# Patient Record
Sex: Male | Born: 1935 | Race: White | Hispanic: No | Marital: Married | Smoking: Former smoker
Health system: Southern US, Community
[De-identification: ages and names within clinical notes are randomized; demographics above are authoritative.]

## PROBLEM LIST (undated history)

## (undated) DIAGNOSIS — R7989 Other specified abnormal findings of blood chemistry: Secondary | ICD-10-CM

## (undated) DIAGNOSIS — J449 Chronic obstructive pulmonary disease, unspecified: Secondary | ICD-10-CM

## (undated) DIAGNOSIS — E785 Hyperlipidemia, unspecified: Secondary | ICD-10-CM

## (undated) DIAGNOSIS — I69920 Aphasia following unspecified cerebrovascular disease: Secondary | ICD-10-CM

## (undated) DIAGNOSIS — J4489 Other specified chronic obstructive pulmonary disease: Secondary | ICD-10-CM

## (undated) DIAGNOSIS — I639 Cerebral infarction, unspecified: Secondary | ICD-10-CM

## (undated) DIAGNOSIS — I10 Essential (primary) hypertension: Secondary | ICD-10-CM

## (undated) HISTORY — DX: Aphasia following unspecified cerebrovascular disease: I69.920

## (undated) HISTORY — DX: Other specified abnormal findings of blood chemistry: R79.89

## (undated) HISTORY — DX: Essential (primary) hypertension: I10

## (undated) HISTORY — DX: Cerebral infarction, unspecified: I63.9

## (undated) HISTORY — DX: Hyperlipidemia, unspecified: E78.5

## (undated) HISTORY — DX: Chronic obstructive pulmonary disease, unspecified: J44.9

## (undated) HISTORY — PX: NO PAST SURGERIES: SHX2092

## (undated) HISTORY — DX: Other specified chronic obstructive pulmonary disease: J44.89

---

## 2000-07-20 DIAGNOSIS — I639 Cerebral infarction, unspecified: Secondary | ICD-10-CM

## 2000-07-20 DIAGNOSIS — I69920 Aphasia following unspecified cerebrovascular disease: Secondary | ICD-10-CM

## 2000-07-20 HISTORY — DX: Aphasia following unspecified cerebrovascular disease: I69.920

## 2000-07-20 HISTORY — DX: Cerebral infarction, unspecified: I63.9

## 2001-03-23 ENCOUNTER — Encounter: Payer: Self-pay | Admitting: Neurology

## 2001-03-23 ENCOUNTER — Inpatient Hospital Stay (HOSPITAL_COMMUNITY): Admission: EM | Admit: 2001-03-23 | Discharge: 2001-04-04 | Payer: Self-pay | Admitting: *Deleted

## 2001-03-23 ENCOUNTER — Encounter: Payer: Self-pay | Admitting: *Deleted

## 2001-03-24 ENCOUNTER — Encounter: Payer: Self-pay | Admitting: Neurology

## 2001-03-25 ENCOUNTER — Encounter: Payer: Self-pay | Admitting: Neurology

## 2001-03-26 ENCOUNTER — Encounter: Payer: Self-pay | Admitting: Neurology

## 2001-04-04 ENCOUNTER — Inpatient Hospital Stay (HOSPITAL_COMMUNITY)
Admission: RE | Admit: 2001-04-04 | Discharge: 2001-04-12 | Payer: Self-pay | Admitting: Physical Medicine & Rehabilitation

## 2001-06-22 ENCOUNTER — Ambulatory Visit (HOSPITAL_COMMUNITY): Admission: RE | Admit: 2001-06-22 | Discharge: 2001-06-22 | Payer: Self-pay | Admitting: Neurology

## 2001-06-22 ENCOUNTER — Encounter: Payer: Self-pay | Admitting: Neurology

## 2001-07-05 ENCOUNTER — Encounter
Admission: RE | Admit: 2001-07-05 | Discharge: 2001-10-03 | Payer: Self-pay | Admitting: Physical Medicine & Rehabilitation

## 2001-09-29 ENCOUNTER — Encounter: Admission: RE | Admit: 2001-09-29 | Discharge: 2001-09-29 | Payer: Self-pay | Admitting: Interventional Radiology

## 2004-08-25 ENCOUNTER — Ambulatory Visit: Payer: Self-pay | Admitting: Internal Medicine

## 2004-09-02 ENCOUNTER — Ambulatory Visit: Payer: Self-pay | Admitting: Internal Medicine

## 2004-12-08 ENCOUNTER — Ambulatory Visit: Payer: Self-pay | Admitting: Internal Medicine

## 2004-12-10 ENCOUNTER — Encounter: Admission: RE | Admit: 2004-12-10 | Discharge: 2005-03-10 | Payer: Self-pay | Admitting: Internal Medicine

## 2005-03-31 ENCOUNTER — Ambulatory Visit: Payer: Self-pay | Admitting: Internal Medicine

## 2005-04-21 ENCOUNTER — Ambulatory Visit: Payer: Self-pay | Admitting: Internal Medicine

## 2005-08-05 ENCOUNTER — Ambulatory Visit: Payer: Self-pay | Admitting: Internal Medicine

## 2005-11-03 ENCOUNTER — Ambulatory Visit: Payer: Self-pay | Admitting: Internal Medicine

## 2005-12-16 ENCOUNTER — Ambulatory Visit: Payer: Self-pay | Admitting: Internal Medicine

## 2006-03-11 ENCOUNTER — Ambulatory Visit: Payer: Self-pay | Admitting: Internal Medicine

## 2006-05-25 ENCOUNTER — Ambulatory Visit: Payer: Self-pay | Admitting: Internal Medicine

## 2006-10-15 DIAGNOSIS — R7989 Other specified abnormal findings of blood chemistry: Secondary | ICD-10-CM | POA: Insufficient documentation

## 2006-10-15 DIAGNOSIS — I69920 Aphasia following unspecified cerebrovascular disease: Secondary | ICD-10-CM

## 2006-10-15 DIAGNOSIS — I6990 Unspecified sequelae of unspecified cerebrovascular disease: Secondary | ICD-10-CM

## 2006-11-11 ENCOUNTER — Ambulatory Visit: Payer: Self-pay | Admitting: Internal Medicine

## 2006-11-11 LAB — CONVERTED CEMR LAB
ALT: 20 units/L (ref 0–40)
AST: 34 units/L (ref 0–37)
BUN: 19 mg/dL (ref 6–23)
Basophils Absolute: 0 10*3/uL (ref 0.0–0.1)
Basophils Relative: 0.7 % (ref 0.0–1.0)
Cholesterol: 172 mg/dL (ref 0–200)
Creatinine, Ser: 1 mg/dL (ref 0.4–1.5)
Eosinophils Absolute: 0.2 10*3/uL (ref 0.0–0.6)
Eosinophils Relative: 2.8 % (ref 0.0–5.0)
HCT: 46.1 % (ref 39.0–52.0)
HDL: 39.1 mg/dL (ref >39.0)
Hemoglobin: 15.6 g/dL (ref 13.0–17.0)
Hgb A1c MFr Bld: 6.1 % — ABNORMAL HIGH (ref 4.6–6.0)
LDL Cholesterol: 115 mg/dL — ABNORMAL HIGH (ref 0–99)
Lymphocytes Relative: 25.2 % (ref 12.0–46.0)
MCHC: 33.8 g/dL (ref 30.0–36.0)
MCV: 93.4 fL (ref 78.0–100.0)
Monocytes Absolute: 0.6 10*3/uL (ref 0.2–0.7)
Monocytes Relative: 8.3 % (ref 3.0–11.0)
Neutro Abs: 4.4 10*3/uL (ref 1.4–7.7)
Neutrophils Relative %: 63 % (ref 43.0–77.0)
Platelets: 264 10*3/uL (ref 150–400)
Potassium: 3.8 meq/L (ref 3.5–5.1)
RBC: 4.93 M/uL (ref 4.22–5.81)
RDW: 13.9 % (ref 11.5–14.6)
Rheumatoid fact SerPl-aCnc: <20 intl units/mL — ABNORMAL LOW (ref 0.0–20.0)
Sed Rate: 30 mm/hr — ABNORMAL HIGH (ref 0–20)
Total CHOL/HDL Ratio: 4.4
Triglycerides: 87 mg/dL (ref 0–149)
VLDL: 17 mg/dL (ref 0–40)
WBC: 7 10*3/uL (ref 4.5–10.5)

## 2006-12-01 ENCOUNTER — Emergency Department (HOSPITAL_COMMUNITY): Admission: EM | Admit: 2006-12-01 | Discharge: 2006-12-01 | Payer: Self-pay | Admitting: Emergency Medicine

## 2006-12-03 DIAGNOSIS — I10 Essential (primary) hypertension: Secondary | ICD-10-CM | POA: Insufficient documentation

## 2006-12-09 ENCOUNTER — Encounter: Payer: Self-pay | Admitting: Internal Medicine

## 2006-12-09 ENCOUNTER — Ambulatory Visit: Payer: Self-pay | Admitting: Internal Medicine

## 2006-12-20 ENCOUNTER — Encounter: Payer: Self-pay | Admitting: Internal Medicine

## 2007-02-15 ENCOUNTER — Ambulatory Visit: Payer: Self-pay | Admitting: Internal Medicine

## 2007-02-15 DIAGNOSIS — E785 Hyperlipidemia, unspecified: Secondary | ICD-10-CM

## 2007-02-15 LAB — CONVERTED CEMR LAB: LDL Goal: 100 mg/dL

## 2007-02-21 ENCOUNTER — Encounter (INDEPENDENT_AMBULATORY_CARE_PROVIDER_SITE_OTHER): Payer: Self-pay | Admitting: *Deleted

## 2007-02-21 LAB — CONVERTED CEMR LAB
BUN: 17 mg/dL (ref 6–23)
HDL: 33.1 mg/dL — ABNORMAL LOW (ref >39.0)
Hgb A1c MFr Bld: 6 % (ref 4.6–6.0)
LDL Cholesterol: 77 mg/dL (ref 0–99)
Total CHOL/HDL Ratio: 3.8
Triglycerides: 86 mg/dL (ref 0–149)
VLDL: 17 mg/dL (ref 0–40)

## 2007-06-01 ENCOUNTER — Ambulatory Visit: Payer: Self-pay | Admitting: Internal Medicine

## 2007-06-08 ENCOUNTER — Encounter (INDEPENDENT_AMBULATORY_CARE_PROVIDER_SITE_OTHER): Payer: Self-pay | Admitting: *Deleted

## 2007-06-08 LAB — CONVERTED CEMR LAB: Hgb A1c MFr Bld: 6 % (ref 4.6–6.0)

## 2007-06-13 ENCOUNTER — Telehealth (INDEPENDENT_AMBULATORY_CARE_PROVIDER_SITE_OTHER): Payer: Self-pay | Admitting: *Deleted

## 2007-08-18 ENCOUNTER — Telehealth (INDEPENDENT_AMBULATORY_CARE_PROVIDER_SITE_OTHER): Payer: Self-pay | Admitting: *Deleted

## 2007-08-19 ENCOUNTER — Telehealth (INDEPENDENT_AMBULATORY_CARE_PROVIDER_SITE_OTHER): Payer: Self-pay | Admitting: *Deleted

## 2007-09-21 ENCOUNTER — Telehealth (INDEPENDENT_AMBULATORY_CARE_PROVIDER_SITE_OTHER): Payer: Self-pay | Admitting: *Deleted

## 2007-11-21 ENCOUNTER — Telehealth (INDEPENDENT_AMBULATORY_CARE_PROVIDER_SITE_OTHER): Payer: Self-pay | Admitting: *Deleted

## 2007-12-02 ENCOUNTER — Telehealth (INDEPENDENT_AMBULATORY_CARE_PROVIDER_SITE_OTHER): Payer: Self-pay | Admitting: *Deleted

## 2007-12-05 ENCOUNTER — Encounter (INDEPENDENT_AMBULATORY_CARE_PROVIDER_SITE_OTHER): Payer: Self-pay | Admitting: *Deleted

## 2007-12-05 ENCOUNTER — Ambulatory Visit: Payer: Self-pay | Admitting: Internal Medicine

## 2007-12-05 ENCOUNTER — Telehealth (INDEPENDENT_AMBULATORY_CARE_PROVIDER_SITE_OTHER): Payer: Self-pay | Admitting: *Deleted

## 2007-12-05 LAB — CONVERTED CEMR LAB: Hgb A1c MFr Bld: 6 % (ref 4.6–6.0)

## 2007-12-06 ENCOUNTER — Encounter: Payer: Self-pay | Admitting: Internal Medicine

## 2007-12-08 ENCOUNTER — Encounter (INDEPENDENT_AMBULATORY_CARE_PROVIDER_SITE_OTHER): Payer: Self-pay | Admitting: *Deleted

## 2008-01-23 ENCOUNTER — Telehealth (INDEPENDENT_AMBULATORY_CARE_PROVIDER_SITE_OTHER): Payer: Self-pay | Admitting: *Deleted

## 2008-03-20 ENCOUNTER — Telehealth (INDEPENDENT_AMBULATORY_CARE_PROVIDER_SITE_OTHER): Payer: Self-pay | Admitting: *Deleted

## 2008-04-12 ENCOUNTER — Telehealth (INDEPENDENT_AMBULATORY_CARE_PROVIDER_SITE_OTHER): Payer: Self-pay | Admitting: *Deleted

## 2008-05-21 ENCOUNTER — Telehealth (INDEPENDENT_AMBULATORY_CARE_PROVIDER_SITE_OTHER): Payer: Self-pay | Admitting: *Deleted

## 2008-05-23 ENCOUNTER — Telehealth (INDEPENDENT_AMBULATORY_CARE_PROVIDER_SITE_OTHER): Payer: Self-pay | Admitting: *Deleted

## 2008-05-31 ENCOUNTER — Telehealth (INDEPENDENT_AMBULATORY_CARE_PROVIDER_SITE_OTHER): Payer: Self-pay | Admitting: *Deleted

## 2008-05-31 ENCOUNTER — Ambulatory Visit: Payer: Self-pay | Admitting: Internal Medicine

## 2008-06-05 LAB — CONVERTED CEMR LAB: Hgb A1c MFr Bld: 6 % (ref 4.6–6.0)

## 2008-06-06 ENCOUNTER — Encounter (INDEPENDENT_AMBULATORY_CARE_PROVIDER_SITE_OTHER): Payer: Self-pay | Admitting: *Deleted

## 2008-06-22 ENCOUNTER — Ambulatory Visit: Payer: Self-pay | Admitting: Internal Medicine

## 2008-06-24 LAB — CONVERTED CEMR LAB
ALT: 17 units/L (ref 0–53)
AST: 28 units/L (ref 0–37)
Albumin: 3.8 g/dL (ref 3.5–5.2)
Alkaline Phosphatase: 86 units/L (ref 39–117)
Total CHOL/HDL Ratio: 3.1
Triglycerides: 55 mg/dL (ref 0–149)

## 2008-06-25 ENCOUNTER — Encounter (INDEPENDENT_AMBULATORY_CARE_PROVIDER_SITE_OTHER): Payer: Self-pay | Admitting: *Deleted

## 2008-06-27 ENCOUNTER — Ambulatory Visit: Payer: Self-pay | Admitting: Internal Medicine

## 2008-06-28 ENCOUNTER — Encounter (INDEPENDENT_AMBULATORY_CARE_PROVIDER_SITE_OTHER): Payer: Self-pay | Admitting: *Deleted

## 2008-10-23 ENCOUNTER — Telehealth (INDEPENDENT_AMBULATORY_CARE_PROVIDER_SITE_OTHER): Payer: Self-pay | Admitting: *Deleted

## 2009-02-21 ENCOUNTER — Ambulatory Visit: Payer: Self-pay | Admitting: Internal Medicine

## 2009-03-20 ENCOUNTER — Ambulatory Visit: Payer: Self-pay | Admitting: Internal Medicine

## 2009-03-20 DIAGNOSIS — B029 Zoster without complications: Secondary | ICD-10-CM | POA: Insufficient documentation

## 2009-05-21 ENCOUNTER — Ambulatory Visit: Payer: Self-pay | Admitting: Internal Medicine

## 2009-05-22 LAB — CONVERTED CEMR LAB
AST: 27 units/L (ref 0–37)
Albumin: 3.7 g/dL (ref 3.5–5.2)
Alkaline Phosphatase: 89 units/L (ref 39–117)
BUN: 15 mg/dL (ref 6–23)
CO2: 32 meq/L (ref 19–32)
Calcium: 9.1 mg/dL (ref 8.4–10.5)
Glucose, Bld: 103 mg/dL — ABNORMAL HIGH (ref 70–99)
Potassium: 4.2 meq/L (ref 3.5–5.1)
Sodium: 137 meq/L (ref 135–145)
Total Protein: 7.2 g/dL (ref 6.0–8.3)

## 2009-11-14 ENCOUNTER — Ambulatory Visit: Payer: Self-pay | Admitting: Internal Medicine

## 2010-03-12 ENCOUNTER — Ambulatory Visit: Payer: Self-pay | Admitting: Internal Medicine

## 2010-03-12 LAB — CONVERTED CEMR LAB
ALT: 14 units/L (ref 0–53)
AST: 23 units/L (ref 0–37)
Bilirubin, Direct: 0.2 mg/dL (ref 0.0–0.3)
Creatinine, Ser: 0.9 mg/dL (ref 0.4–1.5)
Glucose, Bld: 97 mg/dL (ref 70–99)
LDL Cholesterol: 74 mg/dL (ref 0–99)
Potassium: 4.5 meq/L (ref 3.5–5.1)
Total Bilirubin: 0.9 mg/dL (ref 0.3–1.2)
Total CHOL/HDL Ratio: 3

## 2010-07-02 ENCOUNTER — Ambulatory Visit: Payer: Self-pay | Admitting: Internal Medicine

## 2010-07-02 DIAGNOSIS — J449 Chronic obstructive pulmonary disease, unspecified: Secondary | ICD-10-CM

## 2010-07-02 DIAGNOSIS — J4489 Other specified chronic obstructive pulmonary disease: Secondary | ICD-10-CM | POA: Insufficient documentation

## 2010-08-21 NOTE — Assessment & Plan Note (Signed)
Summary: 3-4 MO ROV / TO COME FASTING/NWS   Vital Signs:  Patient profile:   77 year old male Height:      63 inches (160.02 cm) Weight:      142.8 pounds (64.91 kg) O2 Sat:      95 % on Room air Temp:     97.7 degrees F (36.50 degrees C) oral Pulse rate:   62 / minute BP sitting:   120 / 62  (left arm) Cuff size:   regular  Vitals Entered By: Orlan Leavens RMA (March 12, 2010 10:29 AM)  O2 Flow:  Room air CC: 3 month follow-up Is Patient Diabetic? No Pain Assessment Patient in pain? no        Primary Care Provider:  Newt Lukes MD  CC:  3 month follow-up.  History of Present Illness: here for followup - feels well today  1) HTN -  taking meds as rx'd - no adv SE or changes mildly low BP noted occassionally at home - no symptoms however  2) hx shingles - 03/2009 occurence -  still with min tingle in right chest but rash resolved, occ still using lotion  3) dylipidemia -  no problems with meds (no muscle or GI upset) -  100% compliance with meds  4) COPD  continued cough, daily occurence - nonproductive - denies SOB never on meds for same   Preventive Screening-Counseling & Management  Alcohol-Tobacco     Smoking Status: quit  Clinical Review Panels:  Immunizations   Last Tetanus Booster:  Td (05/21/2009)   Last Flu Vaccine:  Fluvax 3+ (05/21/2009)   Last Pneumovax:  Pneumovax (Medicare) (05/21/2009)  Lipid Management   Cholesterol:  127 (05/21/2009)   LDL (bad choesterol):  70 (05/21/2009)   HDL (good cholesterol):  46.80 (05/21/2009)  CBC   WBC:  7.0 (11/11/2006)   RBC:  4.93 (11/11/2006)   Hgb:  15.6 (11/11/2006)   Hct:  46.1 (11/11/2006)   Platelets:  264 (11/11/2006)   MCV  93.4 (11/11/2006)   MCHC  33.8 (11/11/2006)   RDW  13.9 (11/11/2006)   PMN:  63.0 (11/11/2006)   Lymphs:  25.2 (11/11/2006)   Monos:  8.3 (11/11/2006)   Eosinophils:  2.8 (11/11/2006)   Basophil:  0.7 (11/11/2006)  Complete Metabolic Panel   Glucose:  103  (05/21/2009)   Sodium:  137 (05/21/2009)   Potassium:  4.2 (05/21/2009)   Chloride:  99 (05/21/2009)   CO2:  32 (05/21/2009)   BUN:  15 (05/21/2009)   Creatinine:  1.0 (05/21/2009)   Albumin:  3.7 (05/21/2009)   Total Protein:  7.2 (05/21/2009)   Calcium:  9.1 (05/21/2009)   Total Bili:  1.1 (05/21/2009)   Alk Phos:  89 (05/21/2009)   SGPT (ALT):  17 (05/21/2009)   SGOT (AST):  27 (05/21/2009)   Current Medications (verified): 1)  Protonix 40 Mg  Solr (Pantoprazole Sodium) .... Take One Daily 2)  Adult Aspirin Low Strength 81 Mg  Tbdp (Aspirin) 3)  Hydrochlorothiazide 25 Mg  Tabs (Hydrochlorothiazide) .... Take One-Half Tab Daily 4)  Simvastatin 40 Mg  Tabs (Simvastatin) .... Take One At Bedtime 5)  Metoprolol Tartrate 25 Mg  Tabs (Metoprolol Tartrate) .Marland Kitchen.. 1 By Mouth Bid 6)  Fish Oil 1200 Mg Caps (Omega-3 Fatty Acids) .Marland Kitchen.. 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         c/o arthritis pain in left hand in AM  Physical Exam  General:  in no acute distress; cooperative throughout examination; stigmata CVA RUE & dysarthria - wife and dtr at side Lungs:  normal respiratory effort, no intercostal retractions or use of accessory muscles; normal breath sounds bilaterally - no crackles and no wheezes.    Heart:  normal rate, regular rhythm, no murmur, and no rub. BLE without edema. Msk:  arthritic changes left hand Neurologic:  mild dysarthria and word finding difficulty - alert & oriented X3 and cranial nerves II-XII symetrically intact.  RUE with 4+/5 strength - mild HP. gait actatic but walks unaided. follows commands with good comprehension.    Impression & Recommendations:  Problem # 1:  HYPERTENSION (ICD-401.9)  His updated  medication list for this problem includes:    Hydrochlorothiazide 12.5 Mg Tabs (Hydrochlorothiazide) .Marland Kitchen... 1 by mouth once daily    Metoprolol Tartrate 25 Mg Tabs (Metoprolol tartrate) .Marland Kitchen... 1 by mouth two times a day  BP today: 120/62 Prior BP: 130/68 (11/14/2009)  Prior 10 Yr Risk Heart Disease: 22 % (02/15/2007)  Labs Reviewed: K+: 4.2 (05/21/2009) Creat: : 1.0 (05/21/2009)   Chol: 127 (05/21/2009)   HDL: 46.80 (05/21/2009)   LDL: 70 (05/21/2009)   TG: 49.0 (05/21/2009)  Orders: TLB-Creatinine, Blood (82565-CREA) TLB-Potassium (K+) (84132-K)  Problem # 2:  HYPERLIPIDEMIA NEC/NOS (ICD-272.4)  His updated medication list for this problem includes:    Simvastatin 40 Mg Tabs (Simvastatin) .Marland Kitchen... Take one at bedtime  Labs Reviewed: SGOT: 27 (05/21/2009)   SGPT: 17 (05/21/2009)  Lipid Goals: Chol Goal: 200 (02/15/2007)   HDL Goal: 40 (02/15/2007)   LDL Goal: 100 (02/15/2007)   TG Goal: 150 (02/15/2007)  Prior 10 Yr Risk Heart Disease: 22 % (02/15/2007)   HDL:46.80 (05/21/2009), 40.5 (06/22/2008)  LDL:70 (05/21/2009), 76 (06/22/2008)  Chol:127 (05/21/2009), 127 (06/22/2008)  Trig:49.0 (05/21/2009), 55 (06/22/2008)  Orders: TLB-Lipid Panel (80061-LIPID)  Problem # 3:  APHASIA, LE CEREBROVASCULAR DISEASE (ICD-438.11)  His updated medication list for this problem includes:    Adult Aspirin Low Strength 81 Mg Tbdp (Aspirin) .Marland Kitchen... 1 by mouth once daily  Complete Medication List: 1)  Protonix 40 Mg Solr (Pantoprazole sodium) .... Take one daily 2)  Adult Aspirin Low Strength 81 Mg Tbdp (Aspirin) .Marland Kitchen.. 1 by mouth once daily 3)  Hydrochlorothiazide 12.5 Mg Tabs (Hydrochlorothiazide) .Marland Kitchen.. 1 by mouth once daily 4)  Simvastatin 40 Mg Tabs (Simvastatin) .... Take one at bedtime 5)  Metoprolol Tartrate 25 Mg Tabs (Metoprolol tartrate) .Marland Kitchen.. 1 by mouth two times a day 6)  Fish Oil 1200 Mg Caps (Omega-3 fatty acids) .Marland Kitchen.. 1 by mouth once daily  Other Orders: TLB-Hepatic/Liver Function Pnl  (80076-HEPATIC) TLB-Glucose, QUANT (82947-GLU)  Patient Instructions: 1)  it was good to see you today.  2)  test(s) ordered today - your results will be posted on the phone tree for review in 48-72 hours from the time of test completion; call [phone] and enter your 9 digit MRN (listed above on this page, just below your name); if any changes need to be made or there are abnormal results, you will be contacted directly.  3)  changed to 12.5 mg tablet of HCTZ today (no more cutting in half)- no other medication changes today 4)  Please schedule a follow-up appointment in 4-6 months, sooner if problems.  Prescriptions: HYDROCHLOROTHIAZIDE 12.5 MG TABS (HYDROCHLOROTHIAZIDE) 1 by mouth  once daily  #30 x 11   Entered and Authorized by:   Newt Lukes MD   Signed by:   Newt Lukes MD on 03/12/2010   Method used:   Electronically to        [facility] [phone]* (retail)       [address]       [place], [place]  [address]       Ph: [phone]       Fax: [phone]   RxID:   [id number]   Prevention & Chronic Care Immunizations   Influenza vaccine: Fluvax 3+  (05/21/2009)    Tetanus booster: 05/21/2009: Td    Pneumococcal vaccine: Pneumovax (Medicare)  (05/21/2009)    H. zoster vaccine: Not documented  Colorectal Screening   Hemoccult: Not documented    Colonoscopy: Not documented  Other Screening   PSA: Not documented   Smoking status: quit  (03/12/2010)  Lipids   Total Cholesterol: 127  (05/21/2009)   LDL: 70  (05/21/2009)   LDL Direct: Not documented   HDL: 46.80  (05/21/2009)   Triglycerides: 49.0  (05/21/2009)    SGOT (AST): 27  (05/21/2009)   SGPT (ALT): 17  (05/21/2009)   Alkaline phosphatase: 89  (05/21/2009)   Total bilirubin: 1.1  (05/21/2009)  Hypertension   Last Blood Pressure: 120 / 62  (03/12/2010)   Serum creatinine: 1.0  (05/21/2009)   Serum potassium 4.2  (05/21/2009)  Self-Management Support :    Hypertension  self-management support: Not documented    Lipid self-management support: Not documented

## 2010-08-21 NOTE — Assessment & Plan Note (Signed)
Summary: 4-6 MTH FU--STC   Vital Signs:  Patient profile:   77 year old male Height:      63 inches (160.02 cm) Weight:      141.12 pounds (64.15 kg) BMI:     25.09 O2 Sat:      96 % on Room air Temp:     97.2 degrees F (36.22 degrees C) oral Pulse rate:   63 / minute BP sitting:   128 / 62  (left arm) Cuff size:   regular  Vitals Entered By: Orlan Leavens RMA (July 02, 2010 11:00 AM)  O2 Flow:  Room air CC: 4-6 MONTH FOLLOW-UP Is Patient Diabetic? No Pain Assessment Patient in pain? no      Comments Pt want flu shot   Primary Care Provider:  Newt Lukes MD  CC:  4-6 MONTH FOLLOW-UP.  History of Present Illness: here for followup - feels well today  1) HTN -  taking meds as rx'd - no adv SE or changes mildly low BP noted occassionally at home - no symptoms however when low  2) hx shingles - 03/2009 occurence -  still with min tingle in right chest but rash resolved, occ still using lotion  3) dylipidemia -  no problems with meds (no muscle or GI upset) -  100% compliance with meds  4) COPD - no flares continued cough, daily occurence - nonproductive - denies SOB never on meds for same   Clinical Review Panels:  Immunizations   Last Tetanus Booster:  Td (05/21/2009)   Last Flu Vaccine:  Fluvax MCR (07/02/2010)   Last Pneumovax:  Pneumovax (Medicare) (05/21/2009)  Lipid Management   Cholesterol:  137 (03/12/2010)   LDL (bad choesterol):  74 (03/12/2010)   HDL (good cholesterol):  49.20 (03/12/2010)  Diabetes Management   HgBA1C:  6.0 (05/31/2008)   Creatinine:  0.9 (03/12/2010)   Last Flu Vaccine:  Fluvax MCR (07/02/2010)   Last Pneumovax:  Pneumovax (Medicare) (05/21/2009)  CBC   WBC:  7.0 (11/11/2006)   RBC:  4.93 (11/11/2006)   Hgb:  15.6 (11/11/2006)   Hct:  46.1 (11/11/2006)   Platelets:  264 (11/11/2006)   MCV  93.4 (11/11/2006)   MCHC  33.8 (11/11/2006)   RDW  13.9 (11/11/2006)   PMN:  63.0 (11/11/2006)   Lymphs:  25.2  (11/11/2006)   Monos:  8.3 (11/11/2006)   Eosinophils:  2.8 (11/11/2006)   Basophil:  0.7 (11/11/2006)  Complete Metabolic Panel   Glucose:  97 (03/12/2010)   Sodium:  137 (05/21/2009)   Potassium:  4.5 (03/12/2010)   Chloride:  99 (05/21/2009)   CO2:  32 (05/21/2009)   BUN:  15 (05/21/2009)   Creatinine:  0.9 (03/12/2010)   Albumin:  3.9 (03/12/2010)   Total Protein:  7.0 (03/12/2010)   Calcium:  9.1 (05/21/2009)   Total Bili:  0.9 (03/12/2010)   Alk Phos:  84 (03/12/2010)   SGPT (ALT):  14 (03/12/2010)   SGOT (AST):  23 (03/12/2010)   Current Medications (verified): 1)  Protonix 40 Mg  Solr (Pantoprazole Sodium) .... Take One Daily 2)  Adult Aspirin Low Strength 81 Mg  Tbdp (Aspirin) .Marland Kitchen.. 1 By Mouth Once Daily 3)  Hydrochlorothiazide 12.5 Mg Tabs (Hydrochlorothiazide) .Marland Kitchen.. 1 By Mouth Once Daily 4)  Simvastatin 40 Mg  Tabs (Simvastatin) .... Take One At Bedtime 5)  Metoprolol Tartrate 25 Mg  Tabs (Metoprolol Tartrate) .Marland Kitchen.. 1 By Mouth Two Times A Day 6)  Fish Oil 1200 Mg Caps (Omega-3  Fatty Acids) .Marland Kitchen.. 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    Physical Exam  General:  in no acute distress; cooperative throughout examination; stigmata CVA RUE & dysarthria - dtr at side Lungs:  normal respiratory effort, no intercostal retractions or use of accessory muscles; normal breath sounds bilaterally - no crackles and no wheezes.    Heart:  normal rate, regular rhythm, no murmur, and no rub. BLE without edema. Neurologic:  mild dysarthria and word finding difficulty - alert & oriented X3 and cranial nerves II-XII symetrically intact.  RUE with 4+/5 strength - mild HP. gait actatic but walks unaided. follows commands with good comprehension.      Impression & Recommendations:  Problem # 1:  HYPERLIPIDEMIA NEC/NOS (ICD-272.4)  His updated medication list for this problem includes:    Simvastatin 40 Mg Tabs (Simvastatin) .Marland Kitchen... Take one at bedtime  Labs Reviewed: SGOT: 23 (03/12/2010)   SGPT: 14 (03/12/2010)  Lipid Goals: Chol Goal: 200 (02/15/2007)   HDL Goal: 40 (02/15/2007)   LDL Goal: 100 (02/15/2007)   TG Goal: 150 (02/15/2007)  Prior 10 Yr Risk Heart Disease: 22 % (02/15/2007)   HDL:49.20 (03/12/2010), 46.80 (05/21/2009)  LDL:74 (03/12/2010), 70 (05/21/2009)  Chol:137 (03/12/2010), 127 (05/21/2009)  Trig:67.0 (03/12/2010), 49.0 (05/21/2009)  Problem # 2:  HYPERTENSION (ICD-401.9)  His updated medication list for this problem includes:    Hydrochlorothiazide 12.5 Mg Tabs (Hydrochlorothiazide) .Marland Kitchen... 1 by mouth once daily    Metoprolol Tartrate 25 Mg Tabs (Metoprolol tartrate) .Marland Kitchen... 1 by mouth two times a day  BP today: 128/62 Prior BP: 120/62 (03/12/2010)  Prior 10 Yr Risk Heart Disease: 22 % (02/15/2007)  Labs Reviewed: K+: 4.5 (03/12/2010) Creat: : 0.9 (03/12/2010)   Chol: 137 (03/12/2010)   HDL: 49.20 (03/12/2010)   LDL: 74 (03/12/2010)   TG: 67.0 (03/12/2010)  Problem # 3:  APHASIA, LE CEREBROVASCULAR DISEASE (ICD-438.11)  His updated medication list for this problem includes:    Adult Aspirin Low Strength 81 Mg Tbdp (Aspirin) .Marland Kitchen... 1 by mouth once daily  Problem # 4:  COUGH (ICD-786.2) chronic bronchitis - no change or flare likely COPD - hx tobacco abuse - prior CXR 2009 - COPD  pt denies symptoms or problems despite family concerns low normal O2 but exam lungs clear  f/u as needed   Problem # 5:  HYPERGLYCEMIA (ICD-790.6)  plan to reck a1c next OV with labs -  Complete Medication List: 1)  Protonix 40 Mg Solr (Pantoprazole sodium) .... Take one daily 2)  Adult Aspirin Low Strength 81 Mg Tbdp (Aspirin) .Marland Kitchen.. 1 by mouth once daily 3)  Hydrochlorothiazide 12.5 Mg Tabs (Hydrochlorothiazide) .Marland Kitchen.. 1  by mouth once daily 4)  Simvastatin 40 Mg Tabs (Simvastatin) .... Take one at bedtime 5)  Metoprolol Tartrate 25 Mg Tabs (Metoprolol tartrate) .Marland Kitchen.. 1 by mouth two times a day 6)  Fish Oil 1200 Mg Caps (Omega-3 fatty acids) .Marland Kitchen.. 1 by mouth once daily  Other Orders: Administration Flu vaccine - MCR (G0008) Admin 1st Vaccine (96045)  Patient Instructions: 1)  it was good to see you today.  2)  medications reviewed - no changes 3)  Please schedule a follow-up appointment in 3-6 months, sooner if problems.  will check cholesterol and a1c (for sugar monitoring) next visit  Orders Added: 1)  Administration Flu vaccine - MCR [G0008] 2)  Admin 1st Vaccine [90471] 3)  Est. Patient Level III [13086]   Immunizations Administered:  Influenza Vaccine # 1:    Vaccine Type: Fluvax MCR    Site: left deltoid    Mfr: Sanofi Pasteur    Dose: 0.5 ml    Route: IM    Given by: Orlan Leavens RMA    Exp. Date: 01/17/2011    Lot #: [id number]    VIS given: 07/02/10  Flu Vaccine Consent Questions:    Do you have a history of severe allergic reactions to this vaccine? no    Any prior history of allergic reactions to egg and/or gelatin? no    Do you have a sensitivity to the preservative Thimersol? no    Do you have a past history of Guillan-Barre Syndrome? no    Do you currently have an acute febrile illness? no    Have you ever had a severe reaction to latex? no    Vaccine information given and explained to patient? yes   Immunizations Administered:  Influenza Vaccine # 1:    Vaccine Type: Fluvax MCR    Site: left deltoid    Mfr: Sanofi Pasteur    Dose: 0.5 ml    Route: IM    Given by: Orlan Leavens RMA    Exp. Date: 01/17/2011    Lot #: [id number]    VIS given: 07/02/10

## 2010-08-21 NOTE — Assessment & Plan Note (Signed)
Summary: 6 MTH FU  STC   Vital Signs:  Patient profile:   77 year old male Height:      63 inches (160.02 cm) Weight:      144.0 pounds (65.45 kg) BMI:     25.60 O2 Sat:      92 % on Room air Temp:     97.9 degrees F (36.61 degrees C) oral Pulse rate:   83 / minute BP sitting:   130 / 68  (left arm) Cuff size:   regular  Vitals Entered By: Orlan Leavens (November 14, 2009 10:29 AM)  O2 Flow:  Room air CC: 6 month follow-up Is Patient Diabetic? No Pain Assessment Patient in pain? no        Primary Care Provider:  Newt Lukes MD  CC:  6 month follow-up.  History of Present Illness: here for followup - feels well today  1)HTN -  taking meds as rx'd - no adv SE or changes mildly low BP noted today - no symptoms however  2) hx shingles - 03/2009 occurence -  still with min tingle in right chest but rash resolved, occ still using lotion  3) dylipidemia -  no problems with meds (no muscle or GI upset) -  100% compliance with meds  4) cough, daily occurence - nonproductive - denies SOB   Clinical Review Panels:  Immunizations   Last Tetanus Booster:  Td (05/21/2009)   Last Flu Vaccine:  Fluvax 3+ (05/21/2009)   Last Pneumovax:  Pneumovax (Medicare) (05/21/2009)  Lipid Management   Cholesterol:  127 (05/21/2009)   LDL (bad choesterol):  70 (05/21/2009)   HDL (good cholesterol):  46.80 (05/21/2009)  CBC   WBC:  7.0 (11/11/2006)   RBC:  4.93 (11/11/2006)   Hgb:  15.6 (11/11/2006)   Hct:  46.1 (11/11/2006)   Platelets:  264 (11/11/2006)   MCV  93.4 (11/11/2006)   MCHC  33.8 (11/11/2006)   RDW  13.9 (11/11/2006)   PMN:  63.0 (11/11/2006)   Lymphs:  25.2 (11/11/2006)   Monos:  8.3 (11/11/2006)   Eosinophils:  2.8 (11/11/2006)   Basophil:  0.7 (11/11/2006)  Complete Metabolic Panel   Glucose:  103 (05/21/2009)   Sodium:  137 (05/21/2009)   Potassium:  4.2 (05/21/2009)   Chloride:  99 (05/21/2009)   CO2:  32 (05/21/2009)   BUN:  15 (05/21/2009)  Creatinine:  1.0 (05/21/2009)   Albumin:  3.7 (05/21/2009)   Total Protein:  7.2 (05/21/2009)   Calcium:  9.1 (05/21/2009)   Total Bili:  1.1 (05/21/2009)   Alk Phos:  89 (05/21/2009)   SGPT (ALT):  17 (05/21/2009)   SGOT (AST):  27 (05/21/2009)   Current Medications (verified): 1)  Protonix 40 Mg  Solr (Pantoprazole Sodium) .... Take One Daily 2)  Adult Aspirin Low Strength 81 Mg  Tbdp (Aspirin) 3)  Hydrochlorothiazide 25 Mg  Tabs (Hydrochlorothiazide) .... Take One-Half Tab Daily 4)  Simvastatin 40 Mg  Tabs (Simvastatin) .... Take One At Bedtime 5)  Metoprolol Tartrate 25 Mg  Tabs (Metoprolol Tartrate) .Marland Kitchen.. 1 By Mouth Bid 6)  Fish Oil 1200 Mg Caps (Omega-3 Fatty Acids) .Marland Kitchen.. 1 By Mouth Once Daily 7)  Calaclear 1-0.1 % Lotn (Pramoxine-Zinc Acetate) .... 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    Physical Exam  General:  in no acute distress; cooperative throughout examination; stigmata CVA RUE & dysarthria Lungs:  normal respiratory effort, no intercostal retractions or use of accessory muscles; normal breath sounds bilaterally - no crackles and no wheezes.    Heart:  normal rate, regular rhythm, no murmur, and no rub. BLE without edema. Neurologic:  mild dysarthria and word finding difficulty - alert & oriented X3 and cranial nerves II-XII symetrically intact.  RUE with 4+/5 strenght - mild HP. gait actatic but walks unaided. follows commands with good comprehension.    Impression & Recommendations:  Problem # 1:  COUGH (ICD-786.2) ?chronic bronchitis or ?COPD - hx tobacco abuse - prior CXR 2009 - COPD  pt denies symptoms or problems despite family concerns low normal O2 but exam lungs clear  f/u as  needed   Problem # 2:  HYPERTENSION (ICD-401.9)  His updated medication list for this problem includes:    Hydrochlorothiazide 25 Mg Tabs (Hydrochlorothiazide) .Marland Kitchen... Take one-half tab daily    Metoprolol Tartrate 25 Mg Tabs (Metoprolol tartrate) .Marland Kitchen... 1 by mouth bid  BP today: 130/68 Prior BP: 100/42 (05/21/2009)  Prior 10 Yr Risk Heart Disease: 22 % (02/15/2007)  Labs Reviewed: K+: 4.2 (05/21/2009) Creat: : 1.0 (05/21/2009)   Chol: 127 (05/21/2009)   HDL: 46.80 (05/21/2009)   LDL: 70 (05/21/2009)   TG: 49.0 (05/21/2009)  Problem # 3:  HYPERLIPIDEMIA NEC/NOS (ICD-272.4)  His updated medication list for this problem includes:    Simvastatin 40 Mg Tabs (Simvastatin) .Marland Kitchen... Take one at bedtime  Labs Reviewed: SGOT: 27 (05/21/2009)   SGPT: 17 (05/21/2009)  Lipid Goals: Chol Goal: 200 (02/15/2007)   HDL Goal: 40 (02/15/2007)   LDL Goal: 100 (02/15/2007)   TG Goal: 150 (02/15/2007)  Prior 10 Yr Risk Heart Disease: 22 % (02/15/2007)   HDL:46.80 (05/21/2009), 40.5 (06/22/2008)  LDL:70 (05/21/2009), 76 (06/22/2008)  Chol:127 (05/21/2009), 127 (06/22/2008)  Trig:49.0 (05/21/2009), 55 (06/22/2008)  Problem # 4:  CEREBROVASCULAR DISEASE LATE EFFECTS, NOS (ICD-438.9)  His updated medication list for this problem includes:    Adult Aspirin Low Strength 81 Mg Tbdp (Aspirin)  Problem # 5:  APHASIA, LE CEREBROVASCULAR DISEASE (ICD-438.11)  His updated medication list for this problem includes:    Adult Aspirin Low Strength 81 Mg Tbdp (Aspirin)  Problem # 6:  HYPERGLYCEMIA (ICD-790.6) plan to rek a1c next OV with labs -  Time spent with patient, wife and caretaker 25 minutes, more than 50% of this time was spent counseling patient on cough and symptoms of COPD as well as reviewing other medications and home situation  Complete Medication List: 1)  Protonix 40 Mg Solr (Pantoprazole sodium) .... Take one daily 2)  Adult Aspirin Low Strength 81 Mg Tbdp (Aspirin) 3)  Hydrochlorothiazide 25  Mg Tabs (Hydrochlorothiazide) .... Take one-half tab daily 4)  Simvastatin 40 Mg Tabs (Simvastatin) .... Take one at bedtime 5)  Metoprolol Tartrate 25 Mg Tabs (Metoprolol tartrate) .Marland Kitchen.. 1 by mouth bid 6)  Fish Oil 1200 Mg Caps (Omega-3 fatty acids) .Marland Kitchen.. 1 by mouth once daily 7)  Calaclear 1-0.1 % Lotn (Pramoxine-zinc acetate) .... Apply three times a day to rash as needed  Patient Instructions: 1)  it was good to see you today.  2)  no medication changes today - if the coughing or breathing is bad, we can do other testing and try medications to help this! you let us know 3)  Please schedule a follow-up appointment  in 3-4 months, sooner if problems. will check labs at that visit so come in fasting!

## 2010-10-08 ENCOUNTER — Encounter: Payer: Self-pay | Admitting: *Deleted

## 2010-11-03 ENCOUNTER — Other Ambulatory Visit (INDEPENDENT_AMBULATORY_CARE_PROVIDER_SITE_OTHER): Payer: Medicare Other

## 2010-11-03 ENCOUNTER — Encounter: Payer: Self-pay | Admitting: Internal Medicine

## 2010-11-03 ENCOUNTER — Ambulatory Visit (INDEPENDENT_AMBULATORY_CARE_PROVIDER_SITE_OTHER): Payer: Medicare Other | Admitting: Internal Medicine

## 2010-11-03 DIAGNOSIS — R7989 Other specified abnormal findings of blood chemistry: Secondary | ICD-10-CM

## 2010-11-03 DIAGNOSIS — E785 Hyperlipidemia, unspecified: Secondary | ICD-10-CM

## 2010-11-03 DIAGNOSIS — I10 Essential (primary) hypertension: Secondary | ICD-10-CM

## 2010-11-03 DIAGNOSIS — Z79899 Other long term (current) drug therapy: Secondary | ICD-10-CM

## 2010-11-03 DIAGNOSIS — I69920 Aphasia following unspecified cerebrovascular disease: Secondary | ICD-10-CM

## 2010-11-03 LAB — LIPID PANEL
Cholesterol: 126 mg/dL (ref 0–200)
LDL Cholesterol: 68 mg/dL (ref 0–99)
Total CHOL/HDL Ratio: 3

## 2010-11-03 LAB — HEMOGLOBIN A1C: Hgb A1c MFr Bld: 6.1 % (ref 4.6–6.5)

## 2010-11-03 LAB — HEPATIC FUNCTION PANEL: Total Bilirubin: 1.2 mg/dL (ref 0.3–1.2)

## 2010-11-03 NOTE — Patient Instructions (Signed)
It was good to see you today. Test(s) ordered today. Your results will be called to you after review (48-72hours after test completion). If any changes need to be made, you will be notified at that time. Please schedule followup in 3-4 months, call sooner if problems.

## 2010-11-03 NOTE — Assessment & Plan Note (Signed)
Tolerating statin well - check labs now - adjust if needed

## 2010-11-03 NOTE — Assessment & Plan Note (Signed)
Stable deficits without change since 2002 The current medical regimen is effective;  continue present plan and medications.

## 2010-11-03 NOTE — Assessment & Plan Note (Signed)
Lab Results  Component Value Date   HGBA1C 6.0 05/31/2008   Monitor a1c now - bordeline in past

## 2010-11-03 NOTE — Progress Notes (Signed)
  Subjective:    Patient ID: James Mcneil, male    DOB: [birth year], 77 y.o.   MRN: [id number]  HPI  here for followup - feels well today  1) HTN -  taking meds as rx'd - no adv SE or changes mildly low BP noted occassionally at home - no symptoms however when low  2) hx shingles - 03/2009 occurence -  still with min tingle in right chest but rash resolved, occ still using lotion  3) dylipidemia -  no problems with meds (no muscle or GI upset) -  100% compliance with meds  4) COPD - no flares continued cough, daily occurence - nonproductive - denies SOB never on meds for same  Past Medical History  Diagnosis Date  . APHASIA, LE CEREBROVASCULAR DISEASE 10/15/2006  . CEREBROVASCULAR DISEASE LATE EFFECTS, NOS 10/15/2006  . HYPERGLYCEMIA   . COPD 07/02/2010  . HYPERLIPIDEMIA NEC/NOS   . HYPERTENSION   . Stroke 2002    residual R>LUE and mild exp aphasia     Review of Systems  Constitutional: Negative for unexpected weight change.  Respiratory: Negative for cough and shortness of breath.   Cardiovascular: Negative for chest pain and leg swelling.  Neurological: Negative for seizures and headaches.       Objective:   Physical Exam BP 120/62  Pulse 68  Temp(Src) 97.7 F (36.5 C) (Oral)  Ht 5\' 3"  (1.6 m)  Wt 140 lb 12.8 oz (63.866 kg)  BMI 24.94 kg/m2  SpO2 95%  General:  in no acute distress; cooperative throughout examination; stigmata CVA RUE & dysarthria - dtr at side Lungs:  normal respiratory effort, no intercostal retractions or use of accessory muscles; normal breath sounds bilaterally - no crackles and no wheezes.    Heart:  normal rate, regular rhythm, no murmur, and no rub. BLE without edema. Neurologic:  mild dysarthria and word finding difficulty - alert & oriented X3 and cranial nerves II-XII symetrically intact.  RUE with 4+/5 strength - mild HP. gait actatic but walks unaided. follows commands with good comprehension.      Lab Results  Component  Value Date   WBC 7.0 11/11/2006   HGB 15.6 11/11/2006   HCT 46.1 11/11/2006   PLT 264 11/11/2006   CHOL 137 03/12/2010   TRIG 67.0 03/12/2010   HDL 49.20 03/12/2010   ALT 14 03/12/2010   AST 23 03/12/2010   NA 137 05/21/2009   K 4.5 03/12/2010   CL 99 05/21/2009   CREATININE 0.9 03/12/2010   BUN 15 05/21/2009   CO2 32 05/21/2009   HGBA1C 6.0 05/31/2008   Wt Readings from Last 3 Encounters:  11/03/10 140 lb 12.8 oz (63.866 kg)  07/02/10 141 lb 1.9 oz (64.011 kg)  03/12/10 142 lb 12.8 oz (64.774 kg)     Assessment & Plan:  See problem list. Medications and labs reviewed today.

## 2010-11-03 NOTE — Assessment & Plan Note (Signed)
The current medical regimen is effective;  continue present plan and medications. BP Readings from Last 3 Encounters:  11/03/10 120/62  07/02/10 128/62  03/12/10 120/62

## 2010-11-04 ENCOUNTER — Telehealth: Payer: Self-pay | Admitting: Internal Medicine

## 2010-11-04 NOTE — Telephone Encounter (Signed)
Called pt no ansew x's 10 rings.Marland KitchenMarland Kitchen4/17/12@1 :42pm/LMB

## 2010-11-04 NOTE — Telephone Encounter (Signed)
Please call patient - normal cholesterol, liver and a1c results. No medication changes recommended. Thanks.

## 2010-11-05 NOTE — Telephone Encounter (Signed)
Daughter return call back gave results concerning father labs.Marland KitchenMarland Kitchen4/18/12@11 :43am/LMB

## 2010-11-05 NOTE — Telephone Encounter (Signed)
Called pt still no ansew. Look back in EMR got daughter # [phone] call her left msg for daughter to return call concerning father labs..11/05/10@9 :08am/LMB

## 2010-12-05 NOTE — Discharge Summary (Signed)
[place]. [facility]  Patient:    James Mcneil, James Mcneil Visit Number: [id number] MRN: [id number]          Service Type: [facility] Location: 4000 [id number] Attending Physician:  Herold Harms Dictated by:   Junie Bame, P.A. Admit Date:  04/04/2001 Discharge Date: 04/12/2001   CC:         Marlan Palau, M.D.  Pollyann Savoy, M.D.  Titus Dubin. Alwyn Ren, M.D. [facility]  Claudette Laws, M.D.   Discharge Summary  DISCHARGE DIAGNOSES: 1. Left middle cerebral artery cerebrovascular accident. 2. Urinary retention. 3. History of urinary tract infection.  HISTORY OF PRESENT ILLNESS:  The patient is a 77 year old right-handed white male with past medical unremarkable who collapsed at work ([employer]) on 03/23/01.  The patient was admitted for evaluation of right side hemophoresis and aphasia.  Head CT revealed a left MCA cerebrovascular accident and lacunar infarct within the internal capsule.  Neurologist, Dr. Lesia Sago.  Carotid and cerebroangiogram on 03/23/01, demonstrated left common carotid artery completely occluded, and a left MCA thrombus clot with 50% stenosis in the right internal carotid artery.  The patient had a stent placement in the left carotid bifurcation on 04/02/01.  The patient is presently on Plavix, Coumadin, and heparin.  PT report indicated that the patient is ambulating with close supervision greater than 300 feet.  He can transfer sit to stand with supervision.  Speech:  He is on dysphagia III with thin liquids, and has mild aphagia.  His hospital course is significant for an urinary tract infection.  PAST MEDICAL HISTORY: 1. Old cerebrovascular accident. 2. Left ganglion cyst. 3. Rotator cuff tear.  PAST SURGICAL HISTORY:  None.  ADMISSION MEDICATIONS:  Aspirin.  ALLERGIES:  No known drug allergies.  SOCIAL HISTORY:  The patient is married, lives in [place] with wife.  He has three children, one level home.  He was  independent prior to admission. Denies any alcohol or tobacco use.  He is employed at [employer].  REVIEW OF SYSTEMS:  Significant for joint pain, impaired vision.  PRIMARY CARE PHYSICIAN: 1. Marga Melnick, M.D., CVTS. 2. Pollyann Savoy, M.D. 3. Claudette Laws, M.D., urologist.  HOSPITAL COURSE:  Mr. Trageser was admitted to rehabilitation on 04/04/01, for comprehensive inpatient rehabilitation, where he received more than 3 hours of physical therapy and occupational therapy daily.  Mr. Wendorff eight day hospital course while in rehabilitation was significant for urinary retention and safety issues.  On 04/06/01, Mr. Wilkie was started on Flomax 0.4 mg p.o. q.h.s., which eventually had to be increased to 8 mg, plus the addition of adding Urecholine 25 mg p.o. q.i.d. for urinary retention.  With the additions of medications, urinary retention did improve significantly.  The patient was to follow up with his urologist after discharge from rehab.  The patient remained on heparin until therapeutic, and heparin was discontinued, and the patient was started on Coumadin 5 mg p.o. q.d. on April 04, 2001.  Mr. Kneale had a tendency to get out of bed without assistance, therefore ______ was added, and eventually this was discontinued once the patient became independent.  There were no other medical complications during this hospital stay.  He remained on Protonix 40 mg p.o. q.d. for GI protection while on his Coumadin.  He completed a course of Macrodantin 50 mg q.i.d. for 7 days while in rehabilitation for his urinary tract infection.  Latest labs indicated that his hemoglobin was 12.9, hematocrit 37.6, white blood cell count 10.1,  platelet count 413.  He had 4 negative hemoccults out of 4.  Latest INR was 2.6.  Last potassium was 3.7.  Sodium 141, albumin 2.8, AST 26, ALT 25.  Urine culture performed on 04/04/01, demonstrated insignificant growth.  At time of discharge, his vitals were  stable.  PT report indicated that the patient was modified independent, ambulating 300 feet with no assistive device, and could transfer sit to stand moderately independently.  He could perform most activities of daily living with supervision and modified independently.  The patient was on dysphagia III thin liquid diet prior to discharge.  He was discharged home with his family.  PHYSICAL EXAMINATION:  Unremarkable.  DISCHARGE MEDICATIONS: 1. Plavix 75 mg one tab q.d. 2. Coumadin 2 mg 1/2 tablet q.d. 3. Protonix 40 mg q.d. 4. Flomax 0.8 mg q.h.s. 5. Urecholine, he is to follow tapering instructions. 6. Multivitamin daily.  The patient is to make sure he voids within q.8h.  DIET:  No restrictions.  He will have his Coumadin monitored by home health, and they are to call the results to [place].  He will have physical therapy, occupational therapy, R.N., and speech.  FOLLOWUP: 1. Dr. Thomasena Edis on 05/23/01 at 11 a.m. 2. Dr. Corliss Skains on 06/22/01. 3. Dr. Alwyn Ren in 4 to 6 weeks. 4. Dr. Etta Grandchild, his urologist within 4 to 6 weeks. Dictated by:   Junie Bame, P.A. Attending Physician:  Herold Harms DD:  05/24/01 TD:  05/26/01 Job: [id number]

## 2010-12-05 NOTE — Discharge Summary (Signed)
[place]. [facility]  Patient:    TORIBIO, SEIBER Visit Number: [id number] MRN: [id number]          Service Type: [facility] Location: 4000 4033 01 Attending Physician:  Herold Harms Dictated by:   Genene Churn. Love, M.D. Admit Date:  04/04/2001 Disc. Date: 04/04/01   CC:         Reuel Boom L. Thomasena Edis, M.D.   Discharge Summary  PATIENTS ADDRESS:  [address] [place], [place], [place] [address]  DATE OF BIRTH:  [birth year]  INTRODUCTION:  This 77 year old right-handed white married male from 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  At that time he was noted to be aphasic with a right hemiparesis and was brought to the emergency room.  The patient seemed to improve.  He did not strike the floor hard when he fell. The patient was noted to be globally aphasic and have a right hemiparesis, and a CT scan of the brain showed evidence of an old left parietal stroke and old basal ganglia stroke and suspected thrombus in the left middle cerebral artery.  Because of this, an intra-arterial TPA treatment was considered, and Dr. Corliss Skains, neurointerventionalist radiologist, was contacted.  The patient underwent an arteriogram which showed complete occlusion of the left internal carotid artery at the bulb with no evidence of a string sign.  There was partial reconstitution of the cavernous portion noted via retrograde flow in the ophthalmic artery.  There was no retrograde opacification of the left middle cerebral artery distribution noted.  There was a filling defect in the left middle cerebral artery.  The findings and options were discussed between Drs. Willis and [employer], and he underwent an aggressive management approach with possible potential worsening of his neurological deficit.  He underwent an angioplasty with removal of clot in the left internal carotid  artery distribution.  He then had a stent placed and underwent intra-arterial TPA. He also underwent a course of ReoPro.  The patient tolerated this surprisingly well and one day postprocedure was reevaluated and found to have evidence of a right facial droop and would obey occasional command, and his right hemiparesis had markedly improved.  PAST MEDICAL HISTORY: 1. Old left parietal infarct by CT. 2. Ganglion cyst resection of the wrist in the past. 3. History of right rotator cuff repair.  MEDICATIONS:  One aspirin per day.  ALLERGIES:  No known allergies.  SOCIAL HISTORY:  He quit smoking five years prior to this admission.  He did not drink alcohol.  Married.  Lived in [place].  He has three children; one daughter has hypertension, one daughter has had cholecystectomy and hysterectomy.  One son is alive and well.  FAMILY HISTORY:  Atherosclerosis.  His mother died with myocardial infarction. His father died of unknown cause.  He had bad arthritis.  The patient has had five sisters, all alive; one with heart disease and several with arthritis problems.  PHYSICAL EXAMINATION:  VITAL SIGNS:  At the time of admission revealed a blood pressure of 146/65, heart rate 88, respiratory rate 18, afebrile.  HEART:  Atraumatic.  GENERAL:  Unremarkable.  MENTAL STATUS:  He had a left gaze preference.  He blinked to threats on the left but not on the right.  The patient would not verbalize.  He would not follow any commands.  The patient does not have any antigravity movement of the right arm or leg.  He had ability to move the left arm and leg very well and seemed to have fairly normal strength on the left.  He had triple reflection response with Babinski testing on the right, downgoing toe on the left.  Cerebellar testing could not be performed.  LABORATORY DATA:  Sodium 143, potassium 3.6, chloride 109, BUN 19, glucose 145, creatinine 0.8.  Hemoglobin 13, hematocrit 39.0.   Cardiac isoenzymes revealed CK of 122, CK-MB of 5.0, relative index of 4.1.  His lipid profile revealed cholesterol 179, triglycerides 66, HDL 35, total cholesterol/HDL 5.1, LDL cholesterol 131.  Urinalysis revealed moderate leukocyte esterase and many bacteria.  Urine culture grew greater than 100,000 colonies of E. coli sensitive to all antibiotics.  A serum methylmalonic acid is still pending. Homocystine level is high at 12.88.  Liver function tests were normal x 2.  CT scan showing hyperdense left MCA raising the question of acute thrombus. No acute or subacute ischemia or hemorrhage identified.  Findings were discussed at that time with Dr. Andrey Campanile.  His main infarction was external capsule on the left and encephalomalacia involving the left parietal lobe. Swallowing study March 25, 2001, showed penetration with thin liquids and nectar but no aspiration.  Abdominal portable one-view showed the tip in the fundus of the stomach.  CT scan of the brain without contrast March 24, 2001, showed left middle cerebral artery infarction, acute to subacute.  Mild mass effect on left parietal ventricle.  Old left occipital infarct.  CT scan of the head without contrast March 26, 2001, showed further extension of a left middle cerebral artery infarction involving the frontal lobe, temporal lobe, base of ganglia, and no hemorrhage.  The arteriogram procedure showed an occluded left internal carotid artery without the presence of a string sign. Filling defects were noted in the left middle cerebral artery in the trifurcation region, probably representing clot.  There was partial retrograde opacification of the cavernous segment of the left internal carotid artery via the ipsilateral ophthalmic artery and ipsilateral vidian artery.  He was status post thinning of the left carotid bifurcation using a 6 mm x 40 mm precise S.M.A.R.T. stent with pre-stent angioplasty using a 4 mm x 20 mm  open cell coronary angioplasty balloon catheter.  He was status post intra-arterial  thrombolysis with TPA.  He had 50% stenosis involving the right internal carotid artery associated with moderate-sized ulcerative plaque in the carotid bulb on the right.  His 2-D echocardiogram showed no echocardiogram evidence for cardiac source of embolism.  If needed, transesophageal echo was recommended.  Transcranial Dopplers showed normal flow in the left middle cerebral artery. Unable to locate left anterior cerebral artery flow in correct direction, possibly feeding the left middle cerebral artery.  Telemetry showed evidence of normal sinus rhythm.  EKG showed normal sinus rhythm with occasional supraventricular complexes, heart rate of 87.  HOSPITAL COURSE:  The patient was seen in the emergency room by Dr. Anne Hahn, noted to have suspected occlusion of the left middle cerebral artery and discovered to have clot extending from the middle cerebral artery to the internal carotid artery without string sign noted.  Underwent invasive procedure with arteriography, removal of plot, TPA, and stent placement. Postprocedure showed evidence of an aphasia and right hemiparesis.  Was seen in consultation by speech pathology, occupational therapy, and physical therapy.  Was on aspirin, Plavix, and heparin therapy, maintained on heparin and then was switched to Coumadin.  He was seen in consultation by Dr. Thomasena Edis and was thought  to be a good rehabilitation candidate.  At the time of transfer, the patient remained aphasic.  Would follow an occasional one-step command, would then perseverate on the command.  He would, at times, echo speech to him as in an isolation aphasia.  He uses some paraphrases.  There was no facial droop.  He had a slight right ptosis.  He moved all extremities. His strength was just about equal on the right and the left.  Pinprick was approximately equal.  His INR at the time of  transfer was 1.9, and switched from heparin to Coumadin.  MEDICATIONS AT THE TIME OF TRANSFER:  Plavix, Coumadin, Macrobid for urinary tract infection, Ensure, and p.r.n. Reglan and labetalol.  IMPRESSION: 1. Aphasia, code 784.3. 2. Right hemiparesis, code 342.10. 3. 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 4. Past history of cigarette use, code 496. 5. History of hypertension. 6. Dysphagia. 7. Old left parietal infarct by computed tomography scan. 8. Old right rotator cuff tear in the past. 9. Escherichia coli urinary tract infection.  PLAN:  The plan at the time of discharge was to transfer the patient to the rehabilitation service.  MEDICATIONS: 1. Plavix 75 mg per day. 2. Coumadin adjustment per pharmacy. 3. Macrobid 50 mg q.i.d. 4. Added Ensure. 5. Reglan p.r.n. 6. Labetalol p.r.n.  CONDITION ON DISCHARGE:  He is discharged improved from his prehospital status, with aphasia and right hemiparesis.  DIET:  Dysphagia III with thin liquids. Dictated by:   Genene Churn. Love, M.D. Attending Physician:  Herold Harms DD:  04/04/01 TD:  04/05/01 Job: [id number]

## 2010-12-05 NOTE — H&P (Signed)
[place]. [facility]  Patient:    James Mcneil, James Mcneil Visit Number: [id number] MRN: [id number]          Service Type: MED Location: 1800 1845 02 Attending Physician:  James Mcneil Dictated by:   James Mcneil, M.D. Admit Date:  03/23/2001   CC:         James Mcneil, M.D. [facility]  [facility] [address]   History and Physical  HISTORY OF PRESENT ILLNESS:  James Mcneil is a 77 year old right-handed white male born 11/01/1922, with a very unremarkable past medical history.  This patient is followed by James Mcneil, M.D., and has been taking aspirin one tablet a day prior to this admission. The patient has no prior history of cerebrovascular disease or cardiovascular disease.  The patient was doing part-time work at [employer] and was on the job today around 12:30 when he had onset of a collapse while at work. The patient was noted to be aphasic  and have a right hemiparesis.  Although the patient fell, he was caught, did not strike the floor hard.  The patient was brought to the emergency room by EMS and IV fluids were started.  The patient was again noted to have a global aphagia, right hemiparesis, set up CT scan of the head which showed evidence of an old left parietal stroke, old basal gangliar stroke, and evidence of a possible thrombus within the left middle cerebral artery distribution. This patient was seen by neurology and felt possibly to be a TPA candidate either intravenous or intra-arterial.  Dr. Corliss Mcneil has been contacted.  PAST MEDICAL HISTORY:  Significant for: 1. New onset of left middle cerebral artery distribution stroke. 2. Old left __________ infarct by CT. 3. Left ganglion cyst resection of the wrist in the past. 4. History of right rotator cuff tear in the past.  MEDICATIONS: The patient was on aspirin, one tablet a day.  ALLERGIES:  No known drug allergies.  SOCIAL HISTORY:  He quit  smoking five years ago, does not drink alcohol.  This patient is married, lives in the [place], [place], area.  He has three children.  One daughter has hypertension, one daughter has had gallbladder surgery, hysterectomy.  One son is alive and well.  FAMILY MEDICAL HISTORY:  It is notable that mother died with MI, father died of unknown causes.  He had bad arthritis, gallbladder surgery.  The patient has five sisters, all alive, one with heart disease and several with arthritis problems.  REVIEW OF SYSTEMS:  Cannot be obtained at this time.  The patient had no complaints of headache, transient numbness or weakness in the past.  PHYSICAL EXAMINATION:  GENERAL APPEARANCE:  This patient is a fairly well-developed elderly white male who is alert, seems to be nauseated, gagging at the time of examination at times.  VITAL SIGNS:  Blood pressure 146/65, heart rate 88, respiratory rate 18, temperature afebrile.  HEENT:  Head is atraumatic. Eyes: Pupils are equal, round and reactive to light. Discs are flat.  NECK:  Seems to be supple.  No carotid bruits noted.  RESPIRATORY:  Clear.  The patient is diaphoretic.  CARDIOVASCULAR:  No obvious murmurs or rubs.  ABDOMEN:  Positive bowel sounds, no organomegaly or tenderness noted.  EXTREMITIES:  Without significant edema.  NEUROLOGICAL:  Cranial nerves as above.  A very mild left gaze preference is noted.  The patient blinks to threat from the left, not from the  right.  The patient will not verbalize, will not follow verbal commands.  The patient does not have antigravity movement of the right arm or right leg.  He has ability to move the left arm and left leg fairly well and seems to have fairly normal strength on this side.  Sensory testing was very difficult. Deep tendon reflexes were at this time fairly symmetric.  An upgoing toe was noted on the right.  The patient already has prominent triple flexion response  with Babinski on the right leg, downgoing toes noted on the left.  Cerebellar testing could not be performed.  The patient could not be ambulated.  LABORATORY VALUES: Notable for sodium 143, potassium 3.6, chloride 109, BUN 19, glucose 145, creatinine 0.8.  Hemoglobin 13, hematocrit 39.0.  Cardiac enzymes are pending.  EKG reveals normal sinus rhythm with occasional premature supraventricular complexes, heart rate of 87.  CT of the head is as above.  IMPRESSION: New onset of large left middle cerebral stroke.  This patient appears to have evidence of thrombus within the left middle cerebral artery distribution.  This suggests a poor prognosis for any significant recovery. For the same reason, the patient may be a better candidate at this point for intra-arterial TPA rather than intravenous TPA or heparin therapy.  Contacted Dr. Corliss Mcneil who has agreed to perform cerebral angiogram and consider intra-arterial TPA.  PLAN: 1. Admit this patient to [employer] the neurosciences    intensive care unit. 2. Cerebral angiogram with possibility of intra-arterial TPA. 3. Chest x-ray. 4. PT, OT, speech therapy evaluation.  The patient will be NPO at this point. 5. Consider 2-D echocardiogram. 6. Will follow clinical course while in house.  Will repeat a CT scan of the    head tomorrow. Dictated by:   James Mcneil, M.D. Attending Physician:  James Mcneil DD:  03/23/01 TD:  03/23/01 Job: [id number]

## 2011-02-02 ENCOUNTER — Other Ambulatory Visit (INDEPENDENT_AMBULATORY_CARE_PROVIDER_SITE_OTHER): Payer: Medicare Other

## 2011-02-02 ENCOUNTER — Ambulatory Visit (INDEPENDENT_AMBULATORY_CARE_PROVIDER_SITE_OTHER): Payer: Medicare Other | Admitting: Internal Medicine

## 2011-02-02 ENCOUNTER — Encounter: Payer: Self-pay | Admitting: Internal Medicine

## 2011-02-02 DIAGNOSIS — I10 Essential (primary) hypertension: Secondary | ICD-10-CM

## 2011-02-02 DIAGNOSIS — R7989 Other specified abnormal findings of blood chemistry: Secondary | ICD-10-CM

## 2011-02-02 DIAGNOSIS — R5383 Other fatigue: Secondary | ICD-10-CM

## 2011-02-02 DIAGNOSIS — R5381 Other malaise: Secondary | ICD-10-CM

## 2011-02-02 LAB — CBC WITH DIFFERENTIAL/PLATELET
Basophils Relative: 0.5 % (ref 0.0–3.0)
Eosinophils Relative: 2.2 % (ref 0.0–5.0)
Lymphocytes Relative: 21.1 % (ref 12.0–46.0)
Monocytes Relative: 8.8 % (ref 3.0–12.0)
Neutrophils Relative %: 67.4 % (ref 43.0–77.0)
RBC: 4.3 Mil/uL (ref 4.22–5.81)
WBC: 7.3 10*3/uL (ref 4.5–10.5)

## 2011-02-02 LAB — BASIC METABOLIC PANEL
BUN: 20 mg/dL (ref 6–23)
Chloride: 96 mEq/L (ref 96–112)
Glucose, Bld: 114 mg/dL — ABNORMAL HIGH (ref 70–99)
Potassium: 3.9 mEq/L (ref 3.5–5.1)

## 2011-02-02 LAB — HEMOGLOBIN A1C: Hgb A1c MFr Bld: 6.2 % (ref 4.6–6.5)

## 2011-02-02 NOTE — Progress Notes (Signed)
  Subjective:    Patient ID: James Mcneil, male    DOB: 17-Aug-1922, 77 y.o.   MRN: [id number]  HPI   here for followup - feels well today  HTN - the patient reports compliance with medication(s) as prescribed. Denies adverse side effects.  dyslipidemia - on statin; no problems with meds (no muscle or GI upset) - 100% compliance with meds  COPD - no flares continued dry cough, daily occurence - denies SOB or sputum, no shortness of breath -never on meds for same  Past Medical History  Diagnosis Date  . APHASIA, LE CEREBROVASCULAR DISEASE 2002    expressive  . 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 HYPERGLYCEMIA   . COPD 07/02/2010  . HYPERLIPIDEMIA NEC/NOS   . HYPERTENSION   . Stroke 2002    residual R>LUE and mild exp aphasia     Review of Systems  Constitutional: Positive for fatigue. Negative for unexpected weight change.  Respiratory: Negative for cough and shortness of breath.   Cardiovascular: Negative for chest pain and leg swelling.  Neurological: Negative for seizures and headaches.       Objective:   Physical Exam  BP 142/70  Pulse 62  Temp(Src) 98.4 F (36.9 C) (Oral)  Ht 5\' 3"  (1.6 m)  Wt 140 lb 6.4 oz (63.685 kg)  BMI 24.87 kg/m2  SpO2 94%  BP Readings from Last 3 Encounters:  02/02/11 142/70  11/03/10 120/62  07/02/10 128/62    General:  no acute distress; cooperative throughout examination; stigmata of CVA RUE & dysarthria - dtr at side Lungs:  normal respiratory effort, no intercostal retractions or use of accessory muscles; normal breath sounds bilaterally - no crackles and no wheezes.    Heart:  normal rate, regular rhythm, no murmur, and no rub. BLE without edema. Neurologic:  mild dysarthria and word finding difficulty - alert & oriented X3 and cranial nerves II-XII symetrically intact.  RUE with 4+/5 strength - mild HP. gait actatic but walks unaided. follows commands with good comprehension.        Lab Results  Component Value Date   WBC 7.0 11/11/2006   HGB 15.6 11/11/2006   HCT 46.1 11/11/2006   PLT 264 11/11/2006   CHOL 126 11/03/2010   TRIG 47.0 11/03/2010   HDL 48.30 11/03/2010   ALT 15 11/03/2010   AST 24 11/03/2010   NA 137 05/21/2009   K 4.5 03/12/2010   CL 99 05/21/2009   CREATININE 0.9 03/12/2010   BUN 15 05/21/2009   CO2 32 05/21/2009   HGBA1C 6.1 11/03/2010   Wt Readings from Last 3 Encounters:  02/02/11 140 lb 6.4 oz (63.685 kg)  11/03/10 140 lb 12.8 oz (63.866 kg)  07/02/10 141 lb 1.9 oz (64.011 kg)     Assessment & Plan:  See problem list. Medications and labs reviewed today.  Fatigue - nonspecific hx and exam - check screening labs now

## 2011-02-02 NOTE — Assessment & Plan Note (Signed)
Lab Results  Component Value Date   HGBA1C 6.1 11/03/2010   Continue to Monitor a1c for possible progression- bordeline in past Continue diet control

## 2011-02-02 NOTE — Patient Instructions (Addendum)
It was good to see you today. Medications reviewed, no changes at this time. Test(s) ordered today. Your results will be called to you after review (48-72hours after test completion). If any changes need to be made, you will be notified at that time. Please schedule followup in 4 months, call sooner if problems. Copies of your HCPOA and living will will be scanned into our electronic medical records today -= thanks for prorivding a copy of these!

## 2011-02-02 NOTE — Assessment & Plan Note (Signed)
The current medical regimen is effective;  continue present plan and medications. BP Readings from Last 3 Encounters:  02/02/11 126/68  11/03/10 120/62  07/02/10 128/62

## 2011-03-19 ENCOUNTER — Other Ambulatory Visit: Payer: Self-pay | Admitting: Internal Medicine

## 2011-04-01 ENCOUNTER — Other Ambulatory Visit: Payer: Self-pay | Admitting: Internal Medicine

## 2011-06-01 ENCOUNTER — Ambulatory Visit (INDEPENDENT_AMBULATORY_CARE_PROVIDER_SITE_OTHER): Payer: Medicare Other | Admitting: Internal Medicine

## 2011-06-01 ENCOUNTER — Encounter: Payer: Self-pay | Admitting: Internal Medicine

## 2011-06-01 VITALS — BP 92/60 | HR 76 | Temp 97.2°F | Resp 14 | Wt 140.2 lb

## 2011-06-01 DIAGNOSIS — I69920 Aphasia following unspecified cerebrovascular disease: Secondary | ICD-10-CM

## 2011-06-01 DIAGNOSIS — J209 Acute bronchitis, unspecified: Secondary | ICD-10-CM

## 2011-06-01 DIAGNOSIS — I10 Essential (primary) hypertension: Secondary | ICD-10-CM

## 2011-06-01 DIAGNOSIS — R7989 Other specified abnormal findings of blood chemistry: Secondary | ICD-10-CM

## 2011-06-01 MED ORDER — AZITHROMYCIN 250 MG PO TABS: ORAL_TABLET | ORAL | Status: AC

## 2011-06-01 MED ORDER — PREDNISONE (PAK) 10 MG PO TABS: 10.0000 mg | ORAL_TABLET | ORAL | Status: AC

## 2011-06-01 NOTE — Assessment & Plan Note (Signed)
Stable deficits without change since 2002 The current medical regimen is effective;  continue present plan and medications.

## 2011-06-01 NOTE — Assessment & Plan Note (Signed)
Lab Results  Component Value Date   HGBA1C 6.2 02/02/2011   Continue to Monitor a1c for possible progression- bordeline in past Continue diet control

## 2011-06-01 NOTE — Patient Instructions (Signed)
It was good to see you today. Zpak antibiotics and pred pak for your bronchitis symptoms - Your prescription(s) have been submitted to your pharmacy. Please take as directed and contact our office if you believe you are having problem(s) with the medication(s). Medications reviewed, no changes at this time. Please schedule followup in 3-4 months for diabetes mellitus blood check and review, call sooner if problems.

## 2011-06-01 NOTE — Progress Notes (Signed)
  Subjective:    Patient ID: James Mcneil, male    DOB: Sep 20, 1922, 77 y.o.   MRN: [id number]  Sinusitis This is a new problem. The current episode started in the past 7 days. The problem has been gradually worsening since onset. Associated symptoms include congestion and coughing. Pertinent negatives include no headaches, shortness of breath, sinus pressure or sore throat. Past treatments include nothing. The treatment provided no relief.    here for followup - reviewed chronic medical issues today:  HTN - the patient reports compliance with medication(s) as prescribed. Denies adverse side effects.  dyslipidemia - on statin; no problems with meds (no muscle or GI upset) - 100% compliance with meds  COPD - no flares before 4 days ago continued dry cough, daily occurence - denies SOB or sputum, no shortness of breath -never on meds for same  Past Medical History  Diagnosis Date  . APHASIA, LE CEREBROVASCULAR DISEASE 2002    expressive  . 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 HYPERGLYCEMIA   . HYPERLIPIDEMIA NEC/NOS   . HYPERTENSION   . Stroke 2002    residual R>LUE and mild exp aphasia  . COPD      Review of Systems  Constitutional: Positive for fatigue. Negative for unexpected weight change.  HENT: Positive for congestion. Negative for sore throat and sinus pressure.   Respiratory: Positive for cough. Negative for shortness of breath.   Cardiovascular: Negative for chest pain and leg swelling.  Neurological: Negative for seizures and headaches.       Objective:   Physical Exam  BP 92/60  Pulse 76  Temp(Src) 97.2 F (36.2 C) (Oral)  Resp 14  Wt 140 lb 4 oz (63.617 kg)  SpO2 95%  BP Readings from Last 3 Encounters:  06/01/11 92/60  02/02/11 126/68  11/03/10 120/62    General:  no acute distress; cooperative throughout examination; stigmata of CVA RUE & dysarthria - dtr at side Lungs:  normal respiratory effort, no  intercostal retractions or use of accessory muscles; diminished breath sounds bases bilaterally - no crackles and occassional faint end exp wheezes.    Heart:  normal rate, regular rhythm, no murmur, and no rub. BLE without edema. Neurologic:  mild dysarthria and word finding difficulty - alert & oriented X3 and cranial nerves II-XII symetrically intact.  RUE with 4+/5 strength - mild HP. gait actatic but walks unaided. follows commands with good comprehension.      Lab Results  Component Value Date   WBC 7.3 02/02/2011   HGB 14.4 02/02/2011   HCT 42.3 02/02/2011   PLT 236.0 02/02/2011   CHOL 126 11/03/2010   TRIG 47.0 11/03/2010   HDL 48.30 11/03/2010   ALT 15 11/03/2010   AST 24 11/03/2010   NA 134* 02/02/2011   K 3.9 02/02/2011   CL 96 02/02/2011   CREATININE 1.0 02/02/2011   BUN 20 02/02/2011   CO2 29 02/02/2011   TSH 2.28 02/02/2011   HGBA1C 6.2 02/02/2011    Assessment & Plan:  See problem list. Medications and labs reviewed today.   Acute bronchitis - likely viral URI trigger with sick contacts - but given age and COPD, tx with empiric antibiotics and pred pak for bronchospasm

## 2011-06-01 NOTE — Assessment & Plan Note (Signed)
The current medical regimen is generally effective;  continue present plan and medications. BP Readings from Last 3 Encounters:  06/01/11 92/60  02/02/11 126/68  11/03/10 120/62

## 2011-07-11 ENCOUNTER — Other Ambulatory Visit: Payer: Self-pay | Admitting: Internal Medicine

## 2011-08-29 ENCOUNTER — Other Ambulatory Visit: Payer: Self-pay | Admitting: Internal Medicine

## 2011-09-01 ENCOUNTER — Telehealth: Payer: Self-pay | Admitting: *Deleted

## 2011-09-01 NOTE — Telephone Encounter (Signed)
Noted - will review with pt/family at [facility] - thanks

## 2011-09-01 NOTE — Telephone Encounter (Signed)
Called sister Malachi Bonds she has moved appt up schedule for tomorrow 09/02/11.... 09/01/11@10 :24am/LMB

## 2011-09-01 NOTE — Telephone Encounter (Signed)
Want to inform md of some concerns about father. She is the POA, but her sister brings him to the appt, and doesn't feel comfortable talking about him in front of him. Concern and c/o of legs are getting weaker. Been having some leg pain, denies any swelling. Also (L) back pain. Takes him longer to get up out of a chair. He is not eating, loosing weight. Dad has this cough use to be a heavy smoker want to get a cxr, also want to have his carotid artery check, and showing signs of dementia. Pt has appt schedule for 09/29/11 for 4 month f/u. Inform daughter with all those sxs need to move father appt up. She is requesting that we call patient to move appt because if they change appt he will not come..... 09/01/11@9 :13am/LMB

## 2011-09-02 ENCOUNTER — Encounter: Payer: Self-pay | Admitting: Internal Medicine

## 2011-09-02 ENCOUNTER — Other Ambulatory Visit: Payer: Self-pay | Admitting: Internal Medicine

## 2011-09-02 ENCOUNTER — Ambulatory Visit (INDEPENDENT_AMBULATORY_CARE_PROVIDER_SITE_OTHER): Payer: Medicare Other | Admitting: Internal Medicine

## 2011-09-02 ENCOUNTER — Ambulatory Visit (INDEPENDENT_AMBULATORY_CARE_PROVIDER_SITE_OTHER)
Admission: RE | Admit: 2011-09-02 | Discharge: 2011-09-02 | Disposition: A | Discharge status: Home / Self Care | Payer: Medicare Other | Source: Ambulatory Visit | Attending: Internal Medicine | Admitting: Internal Medicine

## 2011-09-02 DIAGNOSIS — E785 Hyperlipidemia, unspecified: Secondary | ICD-10-CM

## 2011-09-02 DIAGNOSIS — J449 Chronic obstructive pulmonary disease, unspecified: Secondary | ICD-10-CM

## 2011-09-02 DIAGNOSIS — I69920 Aphasia following unspecified cerebrovascular disease: Secondary | ICD-10-CM

## 2011-09-02 DIAGNOSIS — R911 Solitary pulmonary nodule: Secondary | ICD-10-CM

## 2011-09-02 DIAGNOSIS — M545 Low back pain, unspecified: Secondary | ICD-10-CM

## 2011-09-02 DIAGNOSIS — J4489 Other specified chronic obstructive pulmonary disease: Secondary | ICD-10-CM

## 2011-09-02 DIAGNOSIS — I10 Essential (primary) hypertension: Secondary | ICD-10-CM

## 2011-09-02 MED ORDER — ACETAMINOPHEN 325 MG PO TABS: 650.0000 mg | ORAL_TABLET | Freq: Two times a day (BID) | ORAL | Status: AC

## 2011-09-02 MED ORDER — ALBUTEROL SULFATE HFA 108 (90 BASE) MCG/ACT IN AERS: 2.0000 | INHALATION_SPRAY | Freq: Four times a day (QID) | RESPIRATORY_TRACT | Status: DC | PRN

## 2011-09-02 NOTE — Assessment & Plan Note (Signed)
The current medical regimen is generally effective;  continue present plan and medications. BP Readings from Last 3 Encounters:  09/02/11 110/80  06/01/11 92/60  02/02/11 126/68

## 2011-09-02 NOTE — Progress Notes (Signed)
Subjective:    Patient ID: James Mcneil, male    DOB: 05/09/1923, 78 y.o.   MRN: [id number]  HPI  here for followup - reviewed chronic medical issues  HTN - the patient reports compliance with medication(s) as prescribed. Denies adverse side effects.  dyslipidemia - on statin; no problems with meds (no muscle or GI upset) - 100% compliance with meds  COPD, remote tobacco - continued dry cough, daily occurence - increase since URI this winter denies shortness of breath, sputum, or prior meds for same  CVA 2002 - s/p L ICA stent, residual R HP - ?increasing leg weakness  Past Medical History  Diagnosis Date  . APHASIA, LE CEREBROVASCULAR DISEASE 2002    expressive  . 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 HYPERGLYCEMIA   . HYPERLIPIDEMIA NEC/NOS   . HYPERTENSION   . Stroke 2002    residual R>LUE and mild exp aphasia  . COPD      Review of Systems  Constitutional: Positive for fatigue. Negative for fever and unexpected weight change.  Respiratory: Negative for chest tightness, shortness of breath and wheezing.   Cardiovascular: Negative for chest pain and leg swelling.  Musculoskeletal: Positive for back pain (hard getting out of chair from seated position).  Neurological: Negative for seizures and headaches.  Psychiatric/Behavioral: Confusion: increase "memory problems" per family.       Objective:   Physical Exam  BP 110/80  Pulse 66  Temp(Src) 97.4 F (36.3 C) (Oral)  Wt 141 lb (63.957 kg)  SpO2 98% Wt Readings from Last 3 Encounters:  09/02/11 141 lb (63.957 kg)  06/01/11 140 lb 4 oz (63.617 kg)  02/02/11 140 lb 6.4 oz (63.685 kg)   General:  no acute distress; cooperative throughout examination; stigmata of CVA RUE & dysarthria - dtr at side Lungs:  normal respiratory effort, no intercostal retractions or use of accessory muscles; normal breath sounds bilaterally - no crackles and no wheezes.    Heart:  normal rate,  regular rhythm, no murmur, and no rub. BLE without edema. Musculoskeletal: Back: full range of motion of thoracic and lumbar spine. Non tender to palpation. DTR's are symmetrically intact. Sensation intact in all dermatomes of the lower extremities. Chronic for a 5 right lower extremity strength from prior stroke, minimally diminished left quad strength to manual muscle testing. patient is able walk without difficulty or assistance and ambulates with patient hip bent-forward, slightly antalgic gait. Rises from sitting position using arm assist from chair Neurologic:  mild dysarthria and word finding difficulty - alert & oriented X3 and cranial nerves II-XII symetrically intact.  RUE with 4+/5 strength - mild HP. gait actatic but walks unaided. follows commands with good comprehension.      Lab Results  Component Value Date   WBC 7.3 02/02/2011   HGB 14.4 02/02/2011   HCT 42.3 02/02/2011   PLT 236.0 02/02/2011   CHOL 126 11/03/2010   TRIG 47.0 11/03/2010   HDL 48.30 11/03/2010   ALT 15 11/03/2010   AST 24 11/03/2010   NA 134* 02/02/2011   K 3.9 02/02/2011   CL 96 02/02/2011   CREATININE 1.0 02/02/2011   BUN 20 02/02/2011   CO2 29 02/02/2011   TSH 2.28 02/02/2011   HGBA1C 6.2 02/02/2011   No results found for this basename: VITAMINB12      Assessment & Plan:   See problem list. Medications and labs reviewed today.  low back pain - suspect DDD,  possible spinal stenosis? Check xray and start conservative care with tylenol - follow up 1 month on symptoms and other testing as needed,

## 2011-09-02 NOTE — Assessment & Plan Note (Signed)
Chronic dry cough,former smoker  slightly worse since URI/bronchitis 05/2011 Check CXR and consider PFTs - trial Ventolin BID x 30d

## 2011-09-02 NOTE — Patient Instructions (Signed)
It was good to see you today. Test(s) ordered today. Your results will be called to you after review (48-72hours after test completion). If any changes need to be made, you will be notified at that time. Start Albuterol inhaler 2x/day and Tylenol 2x/day for cough and back pain symptoms - Your prescription(s) have been submitted to your pharmacy. Please take as directed and contact our office if you believe you are having problem(s) with the medication(s). Other Medications reviewed, no changes at this time. we'll make referral for neck ultrasound to look at stent and ?blockages. Our office will contact you regarding appointment(s) once made. Please schedule followup in 1 months, call sooner if problems.

## 2011-09-02 NOTE — Assessment & Plan Note (Signed)
Stable deficits without change since 2002 Check carotid US as family reports this is no longer followed by [facility] as after L ICA stent (2002) The current medical regimen is effective;  continue present plan and medications.

## 2011-09-09 ENCOUNTER — Ambulatory Visit (INDEPENDENT_AMBULATORY_CARE_PROVIDER_SITE_OTHER)
Admission: RE | Admit: 2011-09-09 | Discharge: 2011-09-09 | Disposition: A | Discharge status: Home / Self Care | Payer: Medicare Other | Source: Ambulatory Visit | Attending: Internal Medicine | Admitting: Internal Medicine

## 2011-09-09 DIAGNOSIS — R911 Solitary pulmonary nodule: Secondary | ICD-10-CM

## 2011-09-12 ENCOUNTER — Other Ambulatory Visit: Payer: Self-pay | Admitting: Internal Medicine

## 2011-09-15 ENCOUNTER — Encounter (INDEPENDENT_AMBULATORY_CARE_PROVIDER_SITE_OTHER): Payer: Medicare Other

## 2011-09-15 DIAGNOSIS — I69920 Aphasia following unspecified cerebrovascular disease: Secondary | ICD-10-CM

## 2011-09-15 DIAGNOSIS — I6529 Occlusion and stenosis of unspecified carotid artery: Secondary | ICD-10-CM

## 2011-09-15 DIAGNOSIS — E785 Hyperlipidemia, unspecified: Secondary | ICD-10-CM

## 2011-09-15 DIAGNOSIS — I10 Essential (primary) hypertension: Secondary | ICD-10-CM

## 2011-09-29 ENCOUNTER — Ambulatory Visit: Payer: Medicare Other | Admitting: Internal Medicine

## 2011-09-30 ENCOUNTER — Ambulatory Visit: Payer: Medicare Other | Admitting: Internal Medicine

## 2011-09-30 ENCOUNTER — Other Ambulatory Visit (INDEPENDENT_AMBULATORY_CARE_PROVIDER_SITE_OTHER): Payer: Medicare Other

## 2011-09-30 ENCOUNTER — Encounter: Payer: Self-pay | Admitting: Internal Medicine

## 2011-09-30 ENCOUNTER — Ambulatory Visit (INDEPENDENT_AMBULATORY_CARE_PROVIDER_SITE_OTHER): Payer: Medicare Other | Admitting: Internal Medicine

## 2011-09-30 VITALS — BP 110/62 | HR 73 | Temp 96.6°F | Ht 66.0 in | Wt 142.0 lb

## 2011-09-30 DIAGNOSIS — E785 Hyperlipidemia, unspecified: Secondary | ICD-10-CM

## 2011-09-30 DIAGNOSIS — R7989 Other specified abnormal findings of blood chemistry: Secondary | ICD-10-CM

## 2011-09-30 DIAGNOSIS — R413 Other amnesia: Secondary | ICD-10-CM

## 2011-09-30 DIAGNOSIS — I10 Essential (primary) hypertension: Secondary | ICD-10-CM

## 2011-09-30 LAB — HEPATIC FUNCTION PANEL
AST: 32 U/L (ref 0–37)
Albumin: 3.7 g/dL (ref 3.5–5.2)
Total Bilirubin: 0.6 mg/dL (ref 0.3–1.2)

## 2011-09-30 LAB — HEMOGLOBIN A1C: Hgb A1c MFr Bld: 6.1 % (ref 4.6–6.5)

## 2011-09-30 LAB — LIPID PANEL
LDL Cholesterol: 60 mg/dL (ref 0–99)
Total CHOL/HDL Ratio: 2

## 2011-09-30 LAB — CBC WITH DIFFERENTIAL/PLATELET
Basophils Relative: 0.8 % (ref 0.0–3.0)
Eosinophils Relative: 2 % (ref 0.0–5.0)
HCT: 41.1 % (ref 39.0–52.0)
Lymphs Abs: 1.6 10*3/uL (ref 0.7–4.0)
MCV: 98.9 fl (ref 78.0–100.0)
Monocytes Absolute: 0.7 10*3/uL (ref 0.1–1.0)
Monocytes Relative: 8.8 % (ref 3.0–12.0)
Neutrophils Relative %: 67.7 % (ref 43.0–77.0)
Platelets: 268 10*3/uL (ref 150.0–400.0)
RBC: 4.16 Mil/uL — ABNORMAL LOW (ref 4.22–5.81)
WBC: 7.6 10*3/uL (ref 4.5–10.5)

## 2011-09-30 LAB — BASIC METABOLIC PANEL
BUN: 23 mg/dL (ref 6–23)
CO2: 30 mEq/L (ref 19–32)
Glucose, Bld: 100 mg/dL — ABNORMAL HIGH (ref 70–99)
Potassium: 4 mEq/L (ref 3.5–5.1)
Sodium: 139 mEq/L (ref 135–145)

## 2011-09-30 LAB — VITAMIN B12: Vitamin B-12: 261 pg/mL (ref 211–911)

## 2011-09-30 NOTE — Patient Instructions (Signed)
It was good to see you today. Test(s) ordered today. Your results will be called to you after review (48-72hours after test completion). If any changes need to be made, you will be notified at that time. We have reviewed your prior records including labs and tests today Medications reviewed, no changes at this time. Please schedule followup in 4 months, call sooner if problems.

## 2011-09-30 NOTE — Progress Notes (Signed)
  Subjective:    Patient ID: James Mcneil, male    DOB: 01/01/1923, 78 y.o.   MRN: [id number]  HPI  here for followup - reviewed chronic medical issues  HTN - the patient reports compliance with medication(s) as prescribed. Denies adverse side effects.  dyslipidemia - on statin; no problems with meds (no muscle or GI upset) - 100% compliance with meds  COPD, remote tobacco - improved dry cough since starting Alb MDI 07/2011 - denies shortness of breath, sputum, or DOe  CVA 2002 - s/p L ICA stent, residual R HP -    Past Medical History  Diagnosis Date  . APHASIA, LE CEREBROVASCULAR DISEASE 2002    expressive  . 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 HYPERGLYCEMIA   . HYPERLIPIDEMIA NEC/NOS   . HYPERTENSION   . Stroke 2002    residual R>LUE and mild exp aphasia  . COPD      Review of Systems  Constitutional: Positive for fatigue. Negative for fever and unexpected weight change.  Respiratory: Negative for chest tightness, shortness of breath and wheezing.   Cardiovascular: Negative for chest pain and leg swelling.  Musculoskeletal: Positive for back pain (hard getting out of chair from seated position).  Neurological: Negative for seizures and headaches.  Psychiatric/Behavioral: Confusion: increase "memory problems" per family.       Objective:   Physical Exam  BP 110/62  Pulse 73  Temp(Src) 96.6 F (35.9 C) (Oral)  Ht 5\' 6"  (1.676 m)  Wt 142 lb 0.6 oz (64.429 kg)  BMI 22.93 kg/m2  SpO2 97% Wt Readings from Last 3 Encounters:  09/30/11 142 lb 0.6 oz (64.429 kg)  09/02/11 141 lb (63.957 kg)  06/01/11 140 lb 4 oz (63.617 kg)   General:  no acute distress; cooperative throughout examination; stigmata of CVA RUE & dysarthria - dtr at side Lungs:  normal respiratory effort, no intercostal retractions or use of accessory muscles; normal breath sounds bilaterally - no crackles and no wheezes.    Heart:  normal rate, regular rhythm,  no murmur, and no rub. BLE without edema. Neurologic:  mild dysarthria and word finding difficulty - alert & oriented X3 and cranial nerves II-XII symetrically intact.  RUE with 4+/5 strength - mild HP. gait actatic but walks unaided. follows commands with good comprehension.      Lab Results  Component Value Date   WBC 7.3 02/02/2011   HGB 14.4 02/02/2011   HCT 42.3 02/02/2011   PLT 236.0 02/02/2011   CHOL 126 11/03/2010   TRIG 47.0 11/03/2010   HDL 48.30 11/03/2010   ALT 15 11/03/2010   AST 24 11/03/2010   NA 134* 02/02/2011   K 3.9 02/02/2011   CL 96 02/02/2011   CREATININE 1.0 02/02/2011   BUN 20 02/02/2011   CO2 29 02/02/2011   TSH 2.28 02/02/2011   HGBA1C 6.2 02/02/2011   No results found for this basename: VITAMINB12    Assessment & Plan:   See problem list. Medications and labs reviewed today.  Memory problems - check screening labs today - consider aricept

## 2011-09-30 NOTE — Assessment & Plan Note (Signed)
The current medical regimen is generally effective;  continue present plan and medications. BP Readings from Last 3 Encounters:  09/30/11 110/62  09/02/11 110/80  06/01/11 92/60

## 2011-09-30 NOTE — Assessment & Plan Note (Signed)
Lab Results  Component Value Date   HGBA1C 6.2 02/02/2011   Continue to Monitor a1c for possible progression- bordeline in past Continue diet control  

## 2011-09-30 NOTE — Assessment & Plan Note (Signed)
Tolerating statin well - check labs now - adjust if needed PVD and prior L ICA stent - carotid US 08/2011 : Carotid ultrasound shows moderate irregularity on right side, 60-79% blockage on R Left prior carotid stent is clear, 40-59% blockage on L - Continue medications as prescribed for medical management and will repeat carotid in 6 months    

## 2011-10-10 ENCOUNTER — Other Ambulatory Visit: Payer: Self-pay | Admitting: Internal Medicine

## 2011-11-09 ENCOUNTER — Other Ambulatory Visit: Payer: Self-pay | Admitting: Internal Medicine

## 2012-01-11 ENCOUNTER — Ambulatory Visit (INDEPENDENT_AMBULATORY_CARE_PROVIDER_SITE_OTHER): Payer: Medicare Other | Admitting: Internal Medicine

## 2012-01-11 ENCOUNTER — Encounter: Payer: Self-pay | Admitting: Internal Medicine

## 2012-01-11 VITALS — BP 110/62 | HR 72 | Temp 97.7°F | Ht 63.0 in | Wt 135.8 lb

## 2012-01-11 DIAGNOSIS — I69920 Aphasia following unspecified cerebrovascular disease: Secondary | ICD-10-CM

## 2012-01-11 DIAGNOSIS — J4489 Other specified chronic obstructive pulmonary disease: Secondary | ICD-10-CM

## 2012-01-11 DIAGNOSIS — I10 Essential (primary) hypertension: Secondary | ICD-10-CM

## 2012-01-11 DIAGNOSIS — R627 Adult failure to thrive: Secondary | ICD-10-CM

## 2012-01-11 DIAGNOSIS — J449 Chronic obstructive pulmonary disease, unspecified: Secondary | ICD-10-CM

## 2012-01-11 MED ORDER — CYANOCOBALAMIN 1000 MCG PO TABS: 1000.0000 ug | ORAL_TABLET | Freq: Every day | ORAL | Status: AC

## 2012-01-11 MED ORDER — TIOTROPIUM BROMIDE MONOHYDRATE 18 MCG IN CAPS: 18.0000 ug | ORAL_CAPSULE | Freq: Every day | RESPIRATORY_TRACT | Status: DC

## 2012-01-11 NOTE — Assessment & Plan Note (Signed)
Stable deficits without change since 2002 recheck carotid US -hx  L ICA stent (2002)

## 2012-01-11 NOTE — Progress Notes (Signed)
Subjective:    Patient ID: James Mcneil, male    DOB: [birth year], 78 y.o.   MRN: [id number]  HPI  here for followup -   family remains concerned with pt overall slow decline: falling at home (4 since 12/12), harder to understand, sleeping, losing weight - pt refuses walker or cane, HH or other assist  Also reviewed chronic medical issues  hypertension - the patient reports compliance with medication(s) as prescribed. Denies adverse side effects.  dyslipidemia - on statin; no problems with meds (no muscle or GI upset) - 100% compliance with meds  COPD, remote tobacco - improved cough since starting Alb MDI 07/2011 - denies shortness of breath or dyspnea on exertion but sputum with cough spasms - no fever  CVA 2002 - s/p L ICA stent, residual R HP - time for new carotid ultrasound per dtrs   Past Medical History  Diagnosis Date  . APHASIA, LE CEREBROVASCULAR DISEASE 2002    expressive  . HYPERGLYCEMIA   . HYPERLIPIDEMIA NEC/NOS   . HYPERTENSION   . Stroke 2002    residual R>LUE and mild exp aphasia  . COPD      Review of Systems  Constitutional: Positive for fatigue. Negative for fever and unexpected weight change.  Respiratory: Negative for chest tightness, shortness of breath and wheezing.   Cardiovascular: Negative for chest pain and leg swelling.  Musculoskeletal: Positive for back pain (hard getting out of chair from seated position).  Neurological: Negative for seizures and headaches.  Psychiatric/Behavioral: Confusion: increase "memory problems" per family.       Objective:   Physical Exam  BP 110/62  Pulse 72  Temp 97.7 F (36.5 C) (Oral)  Ht 5\' 3"  (1.6 m)  Wt 135 lb 12.8 oz (61.598 kg)  BMI 24.06 kg/m2  SpO2 97% Wt Readings from Last 3 Encounters:  01/11/12 135 lb 12.8 oz (61.598 kg)  09/30/11 142 lb 0.6 oz (64.429 kg)  09/02/11 141 lb (63.957 kg)   General:  no acute distress; cooperative throughout examination; stigmata of CVA RUE & dysarthria  - dtrs at side Lungs:  normal respiratory effort, no intercostal retractions or use of accessory muscles; normal breath sounds bilaterally - no crackles and no wheezes.    Heart:  normal rate, regular rhythm, no murmur, and no rub. LLE without edema, RLE with trace swelling (chronic). Neurologic:  mod dysarthria and word finding difficulty - alert & oriented X3 and cranial nerves II-XII symetrically intact.  RUE with 4+/5 strength - mild HP. gait actatic but walks unaided. follows commands with good comprehension.      Lab Results  Component Value Date   WBC 7.6 09/30/2011   HGB 13.6 09/30/2011   HCT 41.1 09/30/2011   PLT 268.0 09/30/2011   CHOL 120 09/30/2011   TRIG 38.0 09/30/2011   HDL 52.60 09/30/2011   ALT 19 09/30/2011   AST 32 09/30/2011   NA 139 09/30/2011   K 4.0 09/30/2011   CL 101 09/30/2011   CREATININE 1.0 09/30/2011   BUN 23 09/30/2011   CO2 30 09/30/2011   TSH 1.91 09/30/2011   HGBA1C 6.1 09/30/2011   Lab Results  Component Value Date   VITAMINB12 261 09/30/2011    Assessment & Plan:   See problem list. Medications and labs reviewed today.  FTT, progressive memory problems - family/pt decline aricept at this time (caused spouse to lose appetite before her death) - will take oral B12 as declines IM monthly replacement -  Recent living will updated and reviewed - today, also reviewed and signed MOST form for pt/family to clarify same for medical orders at home - in short, comfort and supportive approach is preferred, but no hospice qualifying dx present at this time

## 2012-01-11 NOTE — Patient Instructions (Signed)
It was good to see you today. We have reviewed your prior records including labs and tests today MOST form expressing your wishes for comfort care as per your living will signed for you and your family today we'll make referral for carotid doppler check. Our office will contact you regarding appointment(s) once made. Start spiriva inhaler once daily and Vit B12 1000units daily Decrease metoprolol to 25mg  once daily Please schedule followup in 3-4 months, call sooner if problems.

## 2012-01-11 NOTE — Assessment & Plan Note (Signed)
The current medical regimen is generally effective;  continue present plan and medications. Decrease beta-blocker by 1/2 BP Readings from Last 3 Encounters:  01/11/12 110/62  09/30/11 110/62  09/02/11 110/80

## 2012-01-11 NOTE — Assessment & Plan Note (Signed)
Chronic dry cough,former smoker Ventolin added 08/2011 Add spiriva now

## 2012-01-18 ENCOUNTER — Encounter (INDEPENDENT_AMBULATORY_CARE_PROVIDER_SITE_OTHER): Payer: Medicare Other

## 2012-01-18 DIAGNOSIS — R4701 Aphasia: Secondary | ICD-10-CM

## 2012-01-18 DIAGNOSIS — G459 Transient cerebral ischemic attack, unspecified: Secondary | ICD-10-CM

## 2012-01-18 DIAGNOSIS — R269 Unspecified abnormalities of gait and mobility: Secondary | ICD-10-CM

## 2012-01-18 DIAGNOSIS — I6529 Occlusion and stenosis of unspecified carotid artery: Secondary | ICD-10-CM

## 2012-01-18 DIAGNOSIS — I10 Essential (primary) hypertension: Secondary | ICD-10-CM

## 2012-01-18 DIAGNOSIS — I69920 Aphasia following unspecified cerebrovascular disease: Secondary | ICD-10-CM

## 2012-03-09 ENCOUNTER — Other Ambulatory Visit: Payer: Self-pay | Admitting: Internal Medicine

## 2012-03-30 ENCOUNTER — Telehealth: Payer: Self-pay | Admitting: Internal Medicine

## 2012-03-30 NOTE — Telephone Encounter (Signed)
James Mcneil's number is [phone]

## 2012-03-30 NOTE — Telephone Encounter (Signed)
Ok to generate such letter - thanks

## 2012-03-30 NOTE — Telephone Encounter (Signed)
James Mcneil needs a letter stating that he has had a stroke that affected his speech and that he can't sign documents.  James Mcneil has power of attorney.  She can bring a copy if one is not in the record.  She wants to pick up the letter.  Call when is ready

## 2012-03-31 NOTE — Telephone Encounter (Signed)
Letter generated, Lurena Joiner informed in cabinet for pick  up

## 2012-05-10 ENCOUNTER — Ambulatory Visit (INDEPENDENT_AMBULATORY_CARE_PROVIDER_SITE_OTHER): Payer: Medicare Other | Admitting: Internal Medicine

## 2012-05-10 ENCOUNTER — Other Ambulatory Visit (INDEPENDENT_AMBULATORY_CARE_PROVIDER_SITE_OTHER): Payer: Medicare Other

## 2012-05-10 ENCOUNTER — Encounter: Payer: Self-pay | Admitting: Internal Medicine

## 2012-05-10 VITALS — BP 124/60 | HR 68 | Temp 97.0°F | Ht 63.0 in | Wt 139.0 lb

## 2012-05-10 DIAGNOSIS — J449 Chronic obstructive pulmonary disease, unspecified: Secondary | ICD-10-CM

## 2012-05-10 DIAGNOSIS — Z23 Encounter for immunization: Secondary | ICD-10-CM

## 2012-05-10 DIAGNOSIS — I69920 Aphasia following unspecified cerebrovascular disease: Secondary | ICD-10-CM

## 2012-05-10 DIAGNOSIS — E785 Hyperlipidemia, unspecified: Secondary | ICD-10-CM

## 2012-05-10 DIAGNOSIS — I10 Essential (primary) hypertension: Secondary | ICD-10-CM

## 2012-05-10 LAB — LIPID PANEL
HDL: 49.4 mg/dL (ref >39.00)
Total CHOL/HDL Ratio: 2
VLDL: 9.6 mg/dL (ref 0.0–40.0)

## 2012-05-10 MED ORDER — PANTOPRAZOLE SODIUM 40 MG PO TBEC: 40.0000 mg | DELAYED_RELEASE_TABLET | Freq: Every day | ORAL | Status: DC

## 2012-05-10 MED ORDER — SIMVASTATIN 40 MG PO TABS: 40.0000 mg | ORAL_TABLET | Freq: Every day | ORAL | Status: DC

## 2012-05-10 MED ORDER — TIOTROPIUM BROMIDE MONOHYDRATE 18 MCG IN CAPS: 18.0000 ug | ORAL_CAPSULE | Freq: Every day | RESPIRATORY_TRACT | Status: DC

## 2012-05-10 NOTE — Assessment & Plan Note (Signed)
Chronic dry cough, former smoker Ventolin added 08/2011 Added spiriva 12/2011 - inconsistent use due to cost and uncertainty about effectiveness Sample given, encourage compliance, reviewed importance of different medications

## 2012-05-10 NOTE — Assessment & Plan Note (Signed)
Tolerating statin well - check labs now - adjust if needed PVD and prior L ICA stent - carotid US 08/2011 : Carotid ultrasound shows moderate irregularity on right side, 60-79% blockage on R Left prior carotid stent is clear, 40-59% blockage on L - Continue medications as prescribed for medical management and will repeat carotid in 6 months

## 2012-05-10 NOTE — Progress Notes (Signed)
  Subjective:    Patient ID: James Mcneil, male    DOB: 01/19/1923, 78 y.o.   MRN: [id number]  HPI  here for followup - reviewed chronic medical issues  hypertension - the patient reports compliance with medication(s) as prescribed. Denies adverse side effects.  dyslipidemia - on statin; no problems with meds (no muscle or GI upset) - 100% compliance with meds  COPD, remote tobacco - improved cough since starting Alb MDI 07/2011, infrequent compliance with spiriva due to uncertainty re: effectiveness - denies shortness of breath or dyspnea on exertion but increasing cough spasms per dtr - no fever, no sputum  CVA 2002 - s/p L ICA stent, residual R HP - no new deficits, no new weakness   Past Medical History  Diagnosis Date  . APHASIA, LE CEREBROVASCULAR DISEASE 2002    expressive  . HYPERGLYCEMIA   . HYPERLIPIDEMIA NEC/NOS   . HYPERTENSION   . Stroke 2002    residual R>LUE and mild exp aphasia  . COPD      Review of Systems  Constitutional: Negative for fever, fatigue and unexpected weight change.  Respiratory: Negative for chest tightness, shortness of breath and wheezing.   Cardiovascular: Negative for chest pain and leg swelling.  Musculoskeletal: Positive for back pain (hard getting out of chair from seated position).  Neurological: Negative for seizures, weakness and headaches.       Objective:   Physical Exam  BP 124/60  Pulse 68  Temp 97 F (36.1 C) (Oral)  Ht 5\' 3"  (1.6 m)  Wt 139 lb (63.05 kg)  BMI 24.62 kg/m2  SpO2 94% Wt Readings from Last 3 Encounters:  05/10/12 139 lb (63.05 kg)  01/11/12 135 lb 12.8 oz (61.598 kg)  09/30/11 142 lb 0.6 oz (64.429 kg)   General:  no acute distress; cooperative throughout examination; stigmata of CVA RUE & dysarthria - dtr at side Lungs:  normal respiratory effort, no intercostal retractions or use of accessory muscles; normal breath sounds bilaterally - no crackles and no wheezes.    Heart:  normal rate, regular  rhythm, no murmur, and no rub. LLE without edema, RLE with trace swelling (chronic). Neurologic:  mod dysarthria and word finding difficulty - alert & oriented X3 and cranial nerves II-XII symetrically intact.  RUE with 4+/5 strength - mild HP. gait actatic but walks unaided. follows commands with good comprehension.      Lab Results  Component Value Date   WBC 7.6 09/30/2011   HGB 13.6 09/30/2011   HCT 41.1 09/30/2011   PLT 268.0 09/30/2011   CHOL 120 09/30/2011   TRIG 38.0 09/30/2011   HDL 52.60 09/30/2011   ALT 19 09/30/2011   AST 32 09/30/2011   NA 139 09/30/2011   K 4.0 09/30/2011   CL 101 09/30/2011   CREATININE 1.0 09/30/2011   BUN 23 09/30/2011   CO2 30 09/30/2011   TSH 1.91 09/30/2011   HGBA1C 6.1 09/30/2011   Lab Results  Component Value Date   VITAMINB12 261 09/30/2011    Assessment & Plan:   See problem list. Medications and labs reviewed today.

## 2012-05-10 NOTE — Patient Instructions (Signed)
It was good to see you today. We have reviewed your prior records including labs and tests today Test(s) ordered today. Your results will be released to MyChart (or called to you) after review, usually within 72hours after test completion. If any changes need to be made, you will be notified at that same time. Medications reviewed, no changes at this time. Use spiriva daily as instructed for cough, sample given today Please schedule followup in 6 months, call sooner if problems.

## 2012-05-10 NOTE — Assessment & Plan Note (Signed)
Decreased beta-blocker dose by 1/20 December 2011 because of fatigue, which seems improved The current medical regimen is effective;  continue present plan and medications.  BP Readings from Last 3 Encounters:  05/10/12 124/60  01/11/12 110/62  09/30/11 110/62

## 2012-05-10 NOTE — Assessment & Plan Note (Signed)
Stable deficits without change since 2002 Continue medical management of risk factors as ongoing  

## 2012-09-08 ENCOUNTER — Other Ambulatory Visit: Payer: Self-pay | Admitting: Internal Medicine

## 2012-11-08 ENCOUNTER — Ambulatory Visit (INDEPENDENT_AMBULATORY_CARE_PROVIDER_SITE_OTHER): Payer: Medicare Other | Admitting: Internal Medicine

## 2012-11-08 ENCOUNTER — Other Ambulatory Visit (INDEPENDENT_AMBULATORY_CARE_PROVIDER_SITE_OTHER): Payer: Medicare Other

## 2012-11-08 ENCOUNTER — Encounter: Payer: Self-pay | Admitting: Internal Medicine

## 2012-11-08 VITALS — BP 122/60 | HR 68 | Temp 97.4°F | Wt 140.4 lb

## 2012-11-08 DIAGNOSIS — I6990 Unspecified sequelae of unspecified cerebrovascular disease: Secondary | ICD-10-CM

## 2012-11-08 DIAGNOSIS — R7989 Other specified abnormal findings of blood chemistry: Secondary | ICD-10-CM

## 2012-11-08 DIAGNOSIS — I739 Peripheral vascular disease, unspecified: Secondary | ICD-10-CM | POA: Insufficient documentation

## 2012-11-08 DIAGNOSIS — E785 Hyperlipidemia, unspecified: Secondary | ICD-10-CM

## 2012-11-08 DIAGNOSIS — Z0000 Encounter for general adult medical examination without abnormal findings: Secondary | ICD-10-CM

## 2012-11-08 DIAGNOSIS — I10 Essential (primary) hypertension: Secondary | ICD-10-CM

## 2012-11-08 LAB — CBC WITH DIFFERENTIAL/PLATELET
Basophils Absolute: 0 10*3/uL (ref 0.0–0.1)
Eosinophils Absolute: 0.1 10*3/uL (ref 0.0–0.7)
Hemoglobin: 13.3 g/dL (ref 13.0–17.0)
Lymphocytes Relative: 19.5 % (ref 12.0–46.0)
Monocytes Relative: 7.2 % (ref 3.0–12.0)
Neutro Abs: 5.6 10*3/uL (ref 1.4–7.7)
Neutrophils Relative %: 71.1 % (ref 43.0–77.0)
RBC: 4.03 Mil/uL — ABNORMAL LOW (ref 4.22–5.81)
RDW: 14.1 % (ref 11.5–14.6)

## 2012-11-08 LAB — HEPATIC FUNCTION PANEL
ALT: 18 U/L (ref 0–53)
AST: 27 U/L (ref 0–37)
Alkaline Phosphatase: 98 U/L (ref 39–117)
Bilirubin, Direct: 0.2 mg/dL (ref 0.0–0.3)
Total Bilirubin: 1 mg/dL (ref 0.3–1.2)

## 2012-11-08 LAB — BASIC METABOLIC PANEL
BUN: 22 mg/dL (ref 6–23)
CO2: 31 mEq/L (ref 19–32)
Calcium: 9.4 mg/dL (ref 8.4–10.5)
Creatinine, Ser: 1 mg/dL (ref 0.4–1.5)
Glucose, Bld: 109 mg/dL — ABNORMAL HIGH (ref 70–99)
Sodium: 136 mEq/L (ref 135–145)

## 2012-11-08 LAB — LIPID PANEL: Total CHOL/HDL Ratio: 2

## 2012-11-08 LAB — HEMOGLOBIN A1C: Hgb A1c MFr Bld: 6.1 % (ref 4.6–6.5)

## 2012-11-08 LAB — TSH: TSH: 2.28 u[IU]/mL (ref 0.35–5.50)

## 2012-11-08 MED ORDER — HYDROCHLOROTHIAZIDE 12.5 MG PO CAPS: 12.5000 mg | ORAL_CAPSULE | ORAL | Status: DC

## 2012-11-08 NOTE — Assessment & Plan Note (Signed)
Decreased beta-blocker dose by 1/20 December 2011 because of fatigue, which seems improved The current medical regimen is effective;  continue present plan and medications.  BP Readings from Last 3 Encounters:  11/08/12 122/60  05/10/12 124/60  01/11/12 110/62

## 2012-11-08 NOTE — Progress Notes (Signed)
Subjective:    Patient ID: James Mcneil, male    DOB: [birth year], 79 y.o.   MRN: [id number]  HPI  patient is here today for annual wellness. Patient feels well well overall.  Diet: heart healthy  Physical activity: sedentary Depression/mood screen: negative Hearing: intact to whispered voice Visual acuity: grossly normal, performs annual eye exam  ADLs: capable Fall risk: none Home safety: good Cognitive evaluation: intact to orientation, naming, recall and repetition EOL planning: adv directives, full code/ I agree  I have personally reviewed and have noted 1. The patient's medical and social history 2. Their use of alcohol, tobacco or illicit drugs 3. Their current medications and supplements 4. The patient's functional ability including ADL's, fall risks, home safety risks and hearing or visual impairment. 5. Diet and physical activities 6. Evidence for depression or mood disorders   Also reviewed chronic medical issues  hypertension - the patient reports compliance with medication(s) as prescribed. Denies adverse side effects.  dyslipidemia - on statin; no problems with meds- 100% compliance with meds  COPD, remote tobacco - improved cough since starting Alb MDI 07/2011, infrequent compliance with spiriva due to uncertainty re: effectiveness - denies shortness of breath or dyspnea on exertion but increasing cough spasms per dtr - no fever, no sputum  CVA 2002 - s/p L ICA stent, residual R HP - no new deficits, no new weakness   Past Medical History  Diagnosis Date  . APHASIA, LE CEREBROVASCULAR DISEASE 2002    expressive  . HYPERGLYCEMIA   . HYPERLIPIDEMIA NEC/NOS   . HYPERTENSION   . Stroke 2002    residual R>LUE and mild exp aphasia  . COPD    No family history on file. History  Substance Use Topics  . Smoking status: Former Smoker    Types: Cigarettes  . Smokeless tobacco: Not on file  . Alcohol Use: No    Review of Systems  Constitutional:  Negative for fever, fatigue and unexpected weight change.  Respiratory: Negative for chest tightness, shortness of breath and wheezing.   Cardiovascular: Negative for chest pain and leg swelling.  Musculoskeletal: Positive for back pain (hard getting out of chair from seated position).  Neurological: Negative for seizures, weakness and headaches.  No other specific complaints in a complete review of systems (except as listed in HPI above).      Objective:   Physical Exam  BP 122/60  Pulse 68  Temp(Src) 97.4 F (36.3 C) (Oral)  Wt 140 lb 6.4 oz (63.685 kg)  BMI 24.88 kg/m2  SpO2 93% Wt Readings from Last 3 Encounters:  11/08/12 140 lb 6.4 oz (63.685 kg)  05/10/12 139 lb (63.05 kg)  01/11/12 135 lb 12.8 oz (61.598 kg)   General:  no acute distress; cooperative throughout examination; stigmata of CVA RUE & dysarthria - dtr at side Lungs:  normal respiratory effort, no intercostal retractions or use of accessory muscles; normal breath sounds bilaterally - no crackles and no wheezes.    Heart:  normal rate, regular rhythm, no murmur, and no rub. LLE without edema, RLE with trace swelling (chronic). Neurologic:  mod dysarthria and word finding difficulty - alert & oriented X3 and cranial nerves II-XII symetrically intact.  RUE with 4+/5 strength - mild HP. gait actatic but walks unaided. follows commands with good comprehension.      Lab Results  Component Value Date   WBC 7.6 09/30/2011   HGB 13.6 09/30/2011   HCT 41.1 09/30/2011   PLT 268.0 09/30/2011  CHOL 117 05/10/2012   TRIG 48.0 05/10/2012   HDL 49.40 05/10/2012   ALT 19 09/30/2011   AST 32 09/30/2011   NA 139 09/30/2011   K 4.0 09/30/2011   CL 101 09/30/2011   CREATININE 1.0 09/30/2011   BUN 23 09/30/2011   CO2 30 09/30/2011   TSH 1.91 09/30/2011   HGBA1C 6.1 09/30/2011   Lab Results  Component Value Date   VITAMINB12 261 09/30/2011    Assessment & Plan:   AWV/v70.0- Today patient counseled on age appropriate routine  health concerns for screening and prevention, each reviewed and up to date or declined. Immunizations reviewed and up to date or declined. Labs/ECG reviewed. Risk factors for depression reviewed and negative. Hearing function and visual acuity are intact. ADLs screened and addressed as needed. Functional ability and level of safety reviewed and appropriate. Education, counseling and referrals performed based on assessed risks today. Patient provided with a copy of personalized plan for preventive services.  Also see problem list. Medications and labs reviewed today.

## 2012-11-08 NOTE — Assessment & Plan Note (Signed)
Lab Results  Component Value Date   HGBA1C 6.1 09/30/2011   Continue to Monitor a1c for possible progression- bordeline in past Continue diet control

## 2012-11-08 NOTE — Assessment & Plan Note (Signed)
Tolerating statin well - check labs now - adjust if needed PVD and prior L ICA stent in 2002

## 2012-11-08 NOTE — Patient Instructions (Signed)
It was good to see you today. We have reviewed your prior records including labs and tests today Health Maintenance reviewed - all recommended immunizations and age-appropriate screenings are up-to-date. Test(s) ordered today. Your results will be released to MyChart (or called to you) after review, usually within 72hours after test completion. If any changes need to be made, you will be notified at that same time. Medications reviewed and updated, no changes recommended at this time. we'll make referral for follow up carotid ultrasound. Our office will contact you regarding appointment(s) once made. Please schedule followup in 6 months, call sooner if problems.

## 2012-11-08 NOTE — Assessment & Plan Note (Signed)
Hx CVA and IC stent 2002 follow up carotid ultrasound now and labs Continue med mgmt as ongoing, no new symptoms

## 2012-11-08 NOTE — Assessment & Plan Note (Signed)
Stable deficits without change since 2002 Continue medical management of risk factors as ongoing

## 2012-11-10 ENCOUNTER — Encounter (INDEPENDENT_AMBULATORY_CARE_PROVIDER_SITE_OTHER): Payer: Medicare Other

## 2012-11-10 DIAGNOSIS — I6529 Occlusion and stenosis of unspecified carotid artery: Secondary | ICD-10-CM

## 2012-11-10 DIAGNOSIS — I6990 Unspecified sequelae of unspecified cerebrovascular disease: Secondary | ICD-10-CM

## 2012-11-10 DIAGNOSIS — I10 Essential (primary) hypertension: Secondary | ICD-10-CM

## 2012-11-10 DIAGNOSIS — E785 Hyperlipidemia, unspecified: Secondary | ICD-10-CM

## 2012-11-10 DIAGNOSIS — R7989 Other specified abnormal findings of blood chemistry: Secondary | ICD-10-CM

## 2012-11-10 DIAGNOSIS — I739 Peripheral vascular disease, unspecified: Secondary | ICD-10-CM

## 2012-12-24 ENCOUNTER — Other Ambulatory Visit: Payer: Self-pay | Admitting: Internal Medicine

## 2013-02-12 ENCOUNTER — Emergency Department (HOSPITAL_COMMUNITY)
Admission: EM | Admit: 2013-02-12 | Discharge: 2013-02-12 | Disposition: A | Discharge status: Home / Self Care | Payer: Medicare Other | Attending: Emergency Medicine | Admitting: Emergency Medicine

## 2013-02-12 ENCOUNTER — Encounter (HOSPITAL_COMMUNITY): Payer: Self-pay | Admitting: Emergency Medicine

## 2013-02-12 DIAGNOSIS — E785 Hyperlipidemia, unspecified: Secondary | ICD-10-CM | POA: Insufficient documentation

## 2013-02-12 DIAGNOSIS — IMO0002 Reserved for concepts with insufficient information to code with codable children: Secondary | ICD-10-CM | POA: Insufficient documentation

## 2013-02-12 DIAGNOSIS — Z8679 Personal history of other diseases of the circulatory system: Secondary | ICD-10-CM | POA: Insufficient documentation

## 2013-02-12 DIAGNOSIS — Z7982 Long term (current) use of aspirin: Secondary | ICD-10-CM | POA: Insufficient documentation

## 2013-02-12 DIAGNOSIS — Y939 Activity, unspecified: Secondary | ICD-10-CM | POA: Insufficient documentation

## 2013-02-12 DIAGNOSIS — I10 Essential (primary) hypertension: Secondary | ICD-10-CM | POA: Insufficient documentation

## 2013-02-12 DIAGNOSIS — R7989 Other specified abnormal findings of blood chemistry: Secondary | ICD-10-CM | POA: Insufficient documentation

## 2013-02-12 DIAGNOSIS — Z8673 Personal history of transient ischemic attack (TIA), and cerebral infarction without residual deficits: Secondary | ICD-10-CM | POA: Insufficient documentation

## 2013-02-12 DIAGNOSIS — Z87891 Personal history of nicotine dependence: Secondary | ICD-10-CM | POA: Insufficient documentation

## 2013-02-12 DIAGNOSIS — Y929 Unspecified place or not applicable: Secondary | ICD-10-CM | POA: Insufficient documentation

## 2013-02-12 DIAGNOSIS — Z79899 Other long term (current) drug therapy: Secondary | ICD-10-CM | POA: Insufficient documentation

## 2013-02-12 DIAGNOSIS — J4489 Other specified chronic obstructive pulmonary disease: Secondary | ICD-10-CM | POA: Insufficient documentation

## 2013-02-12 DIAGNOSIS — S41112D Laceration without foreign body of left upper arm, subsequent encounter: Secondary | ICD-10-CM

## 2013-02-12 DIAGNOSIS — J449 Chronic obstructive pulmonary disease, unspecified: Secondary | ICD-10-CM | POA: Insufficient documentation

## 2013-02-12 MED ORDER — MUPIROCIN CALCIUM 2 % EX CREA: TOPICAL_CREAM | Freq: Three times a day (TID) | CUTANEOUS | Status: DC

## 2013-02-12 NOTE — ED Notes (Signed)
Granddaughter stated, he fell and hurt hileft arm, he has a skin abrasion and looks like its infected.

## 2013-02-12 NOTE — ED Provider Notes (Signed)
This chart was scribed for non-physician practitioner Trisha Mangle, PA-C working with Derwood Kaplan, MD, by Candelaria Stagers, ED Scribe. This patient was seen in room TR06C/TR06C and the patient's care was started at 4:40 PM  CSN: [id number]     Arrival date & time 02/12/13  1344 History     First MD Initiated Contact with Patient 02/12/13 1636     Chief Complaint  Patient presents with  . Abrasion    The history is provided by the patient and a relative. No language interpreter was used.   HPI Comments: James Mcneil is a 79 y.o. male who presents to the Emergency Department complaining of an abrasion to his left upper arm after falling five days ago hitting his arm on a dresser.  Last tetanus shot was three years ago.  He has no other injuries.  He reports hitting his head but denies LOC.  Pt has h/o stroke  Past Medical History  Diagnosis Date  . APHASIA, LE CEREBROVASCULAR DISEASE 2002    expressive  . HYPERGLYCEMIA   . HYPERLIPIDEMIA NEC/NOS   . HYPERTENSION   . Stroke 2002    residual R>LUE and mild exp aphasia  . COPD    History reviewed. No pertinent past surgical history. No family history on file. History  Substance Use Topics  . Smoking status: Former Smoker    Types: Cigarettes  . Smokeless tobacco: Not on file  . Alcohol Use: No    Review of Systems  Skin: Positive for wound (abrasion to left upper arm).  All other systems reviewed and are negative.    Allergies  Review of patient's allergies indicates no known allergies.  Home Medications   Current Outpatient Rx  Name  Route  Sig  Dispense  Refill  . EXPIRED: acetaminophen (TYLENOL) 325 MG tablet   Oral   Take 2 tablets (650 mg total) by mouth 2 (two) times daily at 10 AM and 5 PM.         . EXPIRED: albuterol (PROVENTIL HFA;VENTOLIN HFA) 108 (90 BASE) MCG/ACT inhaler   Inhalation   Inhale 2 puffs into the lungs every 6 (six) hours as needed for wheezing.   1 Inhaler   0   . aspirin 81 MG  tablet   Oral   Take 81 mg by mouth daily.           . hydrochlorothiazide (MICROZIDE) 12.5 MG capsule   Oral   Take 1 capsule (12.5 mg total) by mouth every morning.   90 capsule   3   . metoprolol tartrate (LOPRESSOR) 25 MG tablet      TAKE ONE TABLET BY MOUTH TWICE DAILY   180 tablet   0   . Omega-3 Fatty Acids (OMEGA-3 FISH OIL) 1200 MG CAPS   Oral   Take 1,200 mg by mouth daily.           . pantoprazole (PROTONIX) 40 MG tablet   Oral   Take 1 tablet (40 mg total) by mouth daily.   90 tablet   3   . simvastatin (ZOCOR) 40 MG tablet   Oral   Take 1 tablet (40 mg total) by mouth at bedtime.   90 tablet   3   . tiotropium (SPIRIVA HANDIHALER) 18 MCG inhalation capsule   Inhalation   Place 1 capsule (18 mcg total) into inhaler and inhale daily.   90 capsule   3    BP 145/66  Pulse 74  Temp(Src) 98  F (36.7 C) (Oral)  Resp 16  SpO2 98% Physical Exam  Nursing note and vitals reviewed. Constitutional: He is oriented to person, place, and time. He appears well-developed and well-nourished. No distress.  HENT:  Head: Normocephalic and atraumatic.  Eyes: EOM are normal.  Neck: Neck supple. No tracheal deviation present.  Cardiovascular: Normal rate.   Pulmonary/Chest: Effort normal. No respiratory distress.  Musculoskeletal: Normal range of motion.  Neurological: He is alert and oriented to person, place, and time.  Skin: Skin is warm and dry.  12 cm x 10 cm U shaped skin tear forming granulation tissue which is oozing serous fluid.      Psychiatric: He has a normal mood and affect. His behavior is normal.    ED Course   Procedures   DIAGNOSTIC STUDIES: Oxygen Saturation is 98% on room air, normal by my interpretation.    COORDINATION OF CARE:   4:41 PM Discussed course of care with pt which includes antibiotic ointment.  Advised pt to follow up with PCP later this week.  Pt understands and agrees.    Labs Reviewed - No data to display No  results found. 1. Skin tear of upper arm without complication, left, subsequent encounter     MDM   I personally performed the services in this documentation, which was scribed in my presence.  The recorded information has been reviewed and considered.   Barnet Pall.   Lonia Skinner [place], PA-C 02/12/13 1658  Lonia Skinner [place], PA-C 02/12/13 [phone]

## 2013-02-12 NOTE — ED Notes (Signed)
Fall and injury occurred on Tuesday, July 22.

## 2013-02-13 NOTE — ED Provider Notes (Signed)
Medical screening examination/treatment/procedure(s) were performed by non-physician practitioner and as supervising physician I was immediately available for consultation/collaboration.  Aras Albarran, MD 02/13/13 0033 

## 2013-02-15 ENCOUNTER — Encounter: Payer: Self-pay | Admitting: Internal Medicine

## 2013-02-15 ENCOUNTER — Ambulatory Visit (INDEPENDENT_AMBULATORY_CARE_PROVIDER_SITE_OTHER)
Admission: RE | Admit: 2013-02-15 | Discharge: 2013-02-15 | Disposition: A | Discharge status: Home / Self Care | Payer: Medicare Other | Source: Ambulatory Visit | Attending: Internal Medicine | Admitting: Internal Medicine

## 2013-02-15 ENCOUNTER — Ambulatory Visit (INDEPENDENT_AMBULATORY_CARE_PROVIDER_SITE_OTHER): Payer: Medicare Other | Admitting: Internal Medicine

## 2013-02-15 ENCOUNTER — Telehealth: Payer: Self-pay | Admitting: Internal Medicine

## 2013-02-15 VITALS — BP 130/60 | HR 83 | Temp 98.0°F | Wt 136.8 lb

## 2013-02-15 DIAGNOSIS — R269 Unspecified abnormalities of gait and mobility: Secondary | ICD-10-CM

## 2013-02-15 DIAGNOSIS — S41112S Laceration without foreign body of left upper arm, sequela: Secondary | ICD-10-CM

## 2013-02-15 DIAGNOSIS — M545 Low back pain, unspecified: Secondary | ICD-10-CM

## 2013-02-15 DIAGNOSIS — I69920 Aphasia following unspecified cerebrovascular disease: Secondary | ICD-10-CM

## 2013-02-15 DIAGNOSIS — IMO0002 Reserved for concepts with insufficient information to code with codable children: Secondary | ICD-10-CM

## 2013-02-15 DIAGNOSIS — S32030A Wedge compression fracture of third lumbar vertebra, initial encounter for closed fracture: Secondary | ICD-10-CM | POA: Insufficient documentation

## 2013-02-15 DIAGNOSIS — S32030D Wedge compression fracture of third lumbar vertebra, subsequent encounter for fracture with routine healing: Secondary | ICD-10-CM

## 2013-02-15 DIAGNOSIS — I10 Essential (primary) hypertension: Secondary | ICD-10-CM

## 2013-02-15 MED ORDER — TRAMADOL HCL 50 MG PO TABS: 50.0000 mg | ORAL_TABLET | Freq: Three times a day (TID) | ORAL | Status: DC | PRN

## 2013-02-15 MED ORDER — TIZANIDINE HCL 4 MG PO TABS: 4.0000 mg | ORAL_TABLET | Freq: Four times a day (QID) | ORAL | Status: DC | PRN

## 2013-02-15 NOTE — Assessment & Plan Note (Signed)
Stable deficits without change since 2002 increasing problems with balance and falls - refer for HHPT/OT Continue medical management of risk factors as ongoing

## 2013-02-15 NOTE — Telephone Encounter (Signed)
Requesting a muscle relaxer.  Please call into [facility] on [address].

## 2013-02-15 NOTE — Patient Instructions (Signed)
It was good to see you today. We have reviewed your prior records including labs and tests today Xray back ordered today. Your results will be released to MyChart (or called to you) after review, usually within 72hours after test completion. If any changes need to be made, you will be notified at that same time. we'll make referral to home health ([facility]) as discussed . Our office will contact you regarding appointment(s) once made. Ok to use neosproin to skin tear and continue wound care as ongoing Medications reviewed and updated, no changes recommended at this time. Please schedule followup in 6 months, call sooner if problems.  Skin Tear Care A skin tear is when the top layer of skin peels off. To repair the skin, your doctor may use:   Tape.  Skin adhesive strips. HOME CARE  Change bandages (dressings) once a day or as told by your doctor.  Gently clean the area with salt (saline) solution or with a mild soap and water.  Do not rub the injured skin dry. Let the area air dry.  Put petroleum jelly or antibiotic cream on the tear. Do not allow a scab to form.  If the bandage sticks, moisten it with warm soapy water and remove it.  Protect the injured skin until it has healed.  Only take medicine as told by your doctor.  Take showers or baths using warm soapy water. Apply a new bandage after the shower or bath.  Keep all doctor visits as told. GET HELP RIGHT AWAY IF:   You have redness, puffiness (swelling), or more pain in the tear.  You have ayellowish-white fluid (pus) coming from the tear.  You have chills.  You have a red streak that goes away from the tear.  You have a bad smell coming from the tear or bandage.  You have a fever or lasting symptoms for more than 2 3 days.  You have a fever and your symptoms suddenly get worse. MAKE SURE YOU:   Understand these instructions.  Will watch this condition.  Will get help right away if you are not doing well  or get worse. Document Released: 04/14/2008 Document Revised: 03/30/2012 Document Reviewed: 01/18/2012 [facility] Patient Information 2014 [place], [place].

## 2013-02-15 NOTE — Telephone Encounter (Signed)
Notified pt daughter Malachi Bonds) gave md response...lmb

## 2013-02-15 NOTE — Telephone Encounter (Signed)
Ok - erx generic zanflex done

## 2013-02-15 NOTE — Addendum Note (Signed)
Addended by: Rene Paci A on: 02/15/2013 05:24 PM   Modules accepted: Orders

## 2013-02-15 NOTE — Assessment & Plan Note (Signed)
Decreased beta-blocker dose by 1/20 December 2011 because of fatigue, which seems improved The current medical regimen is effective;  continue present plan and medications.  BP Readings from Last 3 Encounters:  02/15/13 130/60  02/12/13 126/57  11/08/12 122/60

## 2013-02-15 NOTE — Progress Notes (Signed)
Subjective:    Patient ID: James Mcneil, male    DOB: 10-30-1922, 79 y.o.   MRN: [id number]  HPI  patient is here for follow up - reviewed chronic medical issues  hypertension - the patient reports compliance with medication(s) as prescribed. Denies adverse side effects.  dyslipidemia - on statin; no problems with meds- 100% compliance with meds  COPD, remote tobacco - improved cough since starting Alb MDI 07/2011, infrequent compliance with spiriva due to uncertainty re: effectiveness - denies shortness of breath or dyspnea on exertion but increasing cough spasms per dtr - no fever, no sputum  CVA 2002 - s/p L ICA stent, residual R HP - no new deficits, no new weakness   Past Medical History  Diagnosis Date  . APHASIA, LE CEREBROVASCULAR DISEASE 2002    expressive  . HYPERGLYCEMIA   . HYPERLIPIDEMIA NEC/NOS   . HYPERTENSION   . Stroke 2002    residual R>LUE and mild exp aphasia  . COPD     Review of Systems  Constitutional: Negative for fever, fatigue and unexpected weight change.  Respiratory: Negative for chest tightness, shortness of breath and wheezing.   Cardiovascular: Negative for chest pain and leg swelling.  Musculoskeletal: Positive for back pain (hard getting out of chair from seated position).  Neurological: Negative for seizures, weakness and headaches.        Objective:   Physical Exam  BP 130/60  Pulse 83  Temp(Src) 98 F (36.7 C) (Oral)  Wt 136 lb 12.8 oz (62.052 kg)  BMI 24.24 kg/m2  SpO2 92% Wt Readings from Last 3 Encounters:  02/15/13 136 lb 12.8 oz (62.052 kg)  11/08/12 140 lb 6.4 oz (63.685 kg)  05/10/12 139 lb (63.05 kg)   General:  no acute distress; cooperative throughout examination; stigmata of CVA RUE & dysarthria - dtr at side Lungs:  normal respiratory effort, no intercostal retractions or use of accessory muscles; normal breath sounds bilaterally - no crackles and no wheezes.    Heart:  normal rate, regular rhythm, no murmur,  and no rub. LLE without edema, RLE with trace swelling (chronic). MSkel: Back: full range of motion of thoracic and lumbar spine. Non tender to palpation. Negative straight leg raise. DTR's are symmetrically intact. Sensation intact in all dermatomes of the lower extremities. Full strength to manual muscle testing. patient is able to heel toe walk without difficulty and ambulates with antalgic gait. Skin: large skin tear lateral side left upper arm - healing well without erythema, drainage Neurologic:  mod dysarthria and word finding difficulty - alert & oriented X3 and cranial nerves II-XII symetrically intact.  RUE with 4+/5 strength - mild HP. gait actatic but walks unaided. follows commands with good comprehension.      Lab Results  Component Value Date   WBC 7.8 11/08/2012   HGB 13.3 11/08/2012   HCT 40.2 11/08/2012   PLT 292.0 11/08/2012   CHOL 112 11/08/2012   TRIG 50.0 11/08/2012   HDL 50.00 11/08/2012   ALT 18 11/08/2012   AST 27 11/08/2012   NA 136 11/08/2012   K 4.4 11/08/2012   CL 98 11/08/2012   CREATININE 1.0 11/08/2012   BUN 22 11/08/2012   CO2 31 11/08/2012   TSH 2.28 11/08/2012   HGBA1C 6.1 11/08/2012   Lab Results  Component Value Date   VITAMINB12 261 09/30/2011    Assessment & Plan:   Lumbago - suspect DDD, family requests xray for evaluation of same, esp with fall last  week - r/o compression fx, continue conservative care and HHPT/Ot for gait/balance training - ordered today  Balance disorder - fall last week with ER visit for same  Also see problem list. Medications and labs reviewed today.

## 2013-02-20 ENCOUNTER — Telehealth: Payer: Self-pay | Admitting: *Deleted

## 2013-02-20 NOTE — Telephone Encounter (Signed)
Informed the daughter Lurena Joiner) of NP instructions.  She agreed and did schedule appointment with Dr. Felicity Coyer this week.

## 2013-02-20 NOTE — Telephone Encounter (Signed)
Needs follow up OV

## 2013-02-20 NOTE — Telephone Encounter (Signed)
James Mcneil called states they never received the Tramadol Rx and the Tizanidine is not working.  States pt is not wanting to do anything.  Please advise

## 2013-02-22 ENCOUNTER — Ambulatory Visit (INDEPENDENT_AMBULATORY_CARE_PROVIDER_SITE_OTHER): Payer: Medicare Other | Admitting: Internal Medicine

## 2013-02-22 ENCOUNTER — Encounter: Payer: Self-pay | Admitting: Internal Medicine

## 2013-02-22 VITALS — BP 120/88 | HR 81 | Temp 97.9°F | Wt 137.1 lb

## 2013-02-22 DIAGNOSIS — M549 Dorsalgia, unspecified: Secondary | ICD-10-CM

## 2013-02-22 DIAGNOSIS — S32030S Wedge compression fracture of third lumbar vertebra, sequela: Secondary | ICD-10-CM

## 2013-02-22 DIAGNOSIS — IMO0002 Reserved for concepts with insufficient information to code with codable children: Secondary | ICD-10-CM

## 2013-02-22 MED ORDER — TRAMADOL HCL 50 MG PO TABS: 50.0000 mg | ORAL_TABLET | Freq: Three times a day (TID) | ORAL | Status: AC | PRN

## 2013-02-22 MED ORDER — TIZANIDINE HCL 4 MG PO TABS: 4.0000 mg | ORAL_TABLET | Freq: Three times a day (TID) | ORAL | Status: AC | PRN

## 2013-02-22 NOTE — Patient Instructions (Signed)
It was good to see you today. We have reviewed your prior records including labs and tests today Medications reviewed and updated, no changes recommended at this time. Refills provided Please keep scheduled followup in 6 months, call sooner if problems.   Back, Compression Fracture A compression fracture happens when a force is put upon the length of your spine. Slipping and falling on your bottom are examples of such a force. When this happens, sometimes the force is great enough to compress the building blocks (vertebral bodies) of your spine. Although this causes a lot of pain, this can usually be treated at home, unless your caregiver feels hospitalization is needed for pain control. Your backbone (spinal column) is made up of 24 main vertebral bodies in addition to the sacrum and coccyx (see illustration). These are held together by tough fibrous tissues (ligaments) and by support of your muscles. Nerve roots pass through the openings between the vertebrae. A sudden wrenching move, injury, or a fall may cause a compression fracture of one of the vertebral bodies. This may result in back pain or spread of pain into the belly (abdomen), the buttocks, and down the leg into the foot. Pain may also be created by muscle spasm alone. Large studies have been undertaken to determine the best possible course of action to help your back following injury and also to prevent future problems. The recommendations are as follows. FOLLOWING A COMPRESSION FRACTURE: Do the following only if advised by your caregiver.   If a back brace has been suggested or provided, wear it as directed.  DO NOT stop wearing the back brace unless instructed by your caregiver.  When allowed to return to regular activities, avoid a sedentary life style. Actively exercise. Sporadic weekend binges of tennis, racquetball, water skiing, may actually aggravate or create problems, especially if you are not in condition for that  activity.  Avoid sports requiring sudden body movements until you are in condition for them. Swimming and walking are safer activities.  Maintain good posture.  Avoid obesity.  If not already done, you should have a DEXA scan. Based on the results, be treated for osteoporosis. FOLLOWING ACUTE (SUDDEN) INJURY:  Only take over-the-counter or prescription medicines for pain, discomfort, or fever as directed by your caregiver.  Use bed rest for only the most extreme acute episode. Prolonged bed rest may aggravate your condition. Ice used for acute conditions is effective. Use a large plastic bag filled with ice. Wrap it in a towel. This also provides excellent pain relief. This may be continuous. Or use it for 30 minutes every 2 hours during acute phase, then as needed. Heat for 30 minutes prior to activities is helpful.  As soon as the acute phase (the time when your back is too painful for you to do normal activities) is over, it is important to resume normal activities and work Arboriculturist. Back injuries can cause potentially marked changes in lifestyle. So it is important to attack these problems aggressively.  See your caregiver for continued problems. He or she can help or refer you for appropriate exercises, physical therapy and work hardening if needed.  If you are given narcotic medications for your condition, for the next 24 hours DO NOT:  Drive  Operate machinery or power tools.  Sign legal documents.  DO NOT drink alcohol, take sleeping pills or other medications that may interfere with treatment. If your caregiver has given you a follow-up appointment, it is very important to keep that  appointment. Not keeping the appointment could result in a chronic or permanent injury, pain, and disability. If there is any problem keeping the appointment, you must call back to this facility for assistance.  SEEK IMMEDIATE MEDICAL CARE IF:  You develop numbness, tingling, weakness, or  problems with the use of your arms or legs.  You develop severe back pain not relieved with medications.  You have changes in bowel or bladder control.  You have increasing pain in any areas of the body. Document Released: 07/06/2005 Document Revised: 09/28/2011 Document Reviewed: 02/08/2008 [facility] Patient Information 2014 [place], [place].

## 2013-02-22 NOTE — Assessment & Plan Note (Signed)
Noted progression of L3 compression fx 01/2013 xray since 2013, likely due to trauma/fall mid 01/2013 (ER visit for same) Reviewed options for tx - meds, KP - family and pt prefer meds Improving with tramadol/muscle relaxer - continue same and wean as tolerated over next 6-12 weeks Education and reassurance provided

## 2013-02-22 NOTE — Progress Notes (Signed)
Subjective:    Patient ID: James Mcneil, male    DOB: 1923/02/03, 79 y.o.   MRN: [id number]  HPI  patient is here for follow up  - back pain and L3 compression fracture on xray last week   Past Medical History  Diagnosis Date  . APHASIA, LE CEREBROVASCULAR DISEASE 2002    expressive  . HYPERGLYCEMIA   . HYPERLIPIDEMIA NEC/NOS   . HYPERTENSION   . Stroke 2002    residual R>LUE and mild exp aphasia  . COPD     Review of Systems  Constitutional: Negative for fever, fatigue and unexpected weight change.  Respiratory: Negative for chest tightness, shortness of breath and wheezing.   Cardiovascular: Negative for chest pain and leg swelling.  Musculoskeletal: Positive for back pain (hard getting out of chair from seated position).  Neurological: Negative for seizures, weakness and headaches.        Objective:   Physical Exam  BP 120/88  Pulse 81  Temp(Src) 97.9 F (36.6 C) (Oral)  Wt 137 lb 1.9 oz (62.197 kg)  BMI 24.3 kg/m2  SpO2 93% Wt Readings from Last 3 Encounters:  02/22/13 137 lb 1.9 oz (62.197 kg)  02/15/13 136 lb 12.8 oz (62.052 kg)  11/08/12 140 lb 6.4 oz (63.685 kg)   General:  no acute distress; cooperative throughout examination; stigmata of CVA RUE & dysarthria - dtr x 2 at side Lungs:  normal respiratory effort, no intercostal retractions or use of accessory muscles; normal breath sounds bilaterally - no crackles and no wheezes.    Heart:  normal rate, regular rhythm, no murmur, and no rub. LLE without edema, RLE with trace swelling (chronic). MSkel: Back: full range of motion of thoracic and lumbar spine. Non tender to palpation. Negative straight leg raise. DTR's are symmetrically intact. Sensation intact in all dermatomes of the lower extremities. Full strength to manual muscle testing. patient is able to heel toe walk without difficulty and ambulates with antalgic gait. Skin: resolving/healing skin tear lateral side left upper arm - healing well  without erythema, drainage Neurologic:  mod dysarthria and word finding difficulty - alert & oriented X3 and cranial nerves II-XII symetrically intact.  RUE with 4+/5 strength - mild HP. gait actatic but walks unaided. follows commands with good comprehension.      Lab Results  Component Value Date   WBC 7.8 11/08/2012   HGB 13.3 11/08/2012   HCT 40.2 11/08/2012   PLT 292.0 11/08/2012   CHOL 112 11/08/2012   TRIG 50.0 11/08/2012   HDL 50.00 11/08/2012   ALT 18 11/08/2012   AST 27 11/08/2012   NA 136 11/08/2012   K 4.4 11/08/2012   CL 98 11/08/2012   CREATININE 1.0 11/08/2012   BUN 22 11/08/2012   CO2 31 11/08/2012   TSH 2.28 11/08/2012   HGBA1C 6.1 11/08/2012   Lab Results  Component Value Date   VITAMINB12 261 09/30/2011   Dg Lumbar Spine 2-3 Views  02/15/2013   *RADIOLOGY REPORT*  Clinical Data: Pain post fall.  LUMBAR SPINE - 2-3 VIEW  Comparison: 09/02/2011  Findings: Progressive compression fracture deformity of L3 with greater than 50 % loss of height centrally.  No definite retropulsion or posterior element involvement.  Anterior endplate spurring at all lumbar levels.  Normal alignment.  Patchy aortic calcifications without suggestion of aneurysm.  IMPRESSION:  1.  Progressive L3 compression fracture deformity since previous exam. Per CMS PQRS reporting requirements (PQRS Measure 24): Given the patient's age of greater  than 50 and the fracture site (hip, distal radius, or spine), the patient should be tested for osteoporosis using DXA, and the appropriate treatment considered based on the DXA results.   Original Report Authenticated By: D. Andria Rhein, MD   Assessment & Plan:   see problem list. Medications and labs reviewed today.

## 2013-03-06 ENCOUNTER — Telehealth: Payer: Self-pay | Admitting: *Deleted

## 2013-03-06 NOTE — Telephone Encounter (Signed)
Spoke with Junious Dresser advised of MDs verbal ok.

## 2013-03-06 NOTE — Telephone Encounter (Signed)
James Mcneil from [facility] called requesting Occupational Therapy twice a week for 3 weeks.  Please advise

## 2013-03-06 NOTE — Telephone Encounter (Signed)
Verbal ok?

## 2013-04-05 ENCOUNTER — Ambulatory Visit (INDEPENDENT_AMBULATORY_CARE_PROVIDER_SITE_OTHER): Payer: Medicare Other | Admitting: Internal Medicine

## 2013-04-05 ENCOUNTER — Ambulatory Visit (INDEPENDENT_AMBULATORY_CARE_PROVIDER_SITE_OTHER)
Admission: RE | Admit: 2013-04-05 | Discharge: 2013-04-05 | Disposition: A | Discharge status: Home / Self Care | Payer: Medicare Other | Source: Ambulatory Visit | Attending: Internal Medicine | Admitting: Internal Medicine

## 2013-04-05 ENCOUNTER — Encounter: Payer: Self-pay | Admitting: Internal Medicine

## 2013-04-05 VITALS — BP 120/70 | HR 93 | Temp 97.5°F | Wt 126.0 lb

## 2013-04-05 DIAGNOSIS — R05 Cough: Secondary | ICD-10-CM | POA: Insufficient documentation

## 2013-04-05 DIAGNOSIS — L89309 Pressure ulcer of unspecified buttock, unspecified stage: Secondary | ICD-10-CM

## 2013-04-05 DIAGNOSIS — L8991 Pressure ulcer of unspecified site, stage 1: Secondary | ICD-10-CM

## 2013-04-05 DIAGNOSIS — J449 Chronic obstructive pulmonary disease, unspecified: Secondary | ICD-10-CM

## 2013-04-05 DIAGNOSIS — I10 Essential (primary) hypertension: Secondary | ICD-10-CM

## 2013-04-05 DIAGNOSIS — L89301 Pressure ulcer of unspecified buttock, stage 1: Secondary | ICD-10-CM | POA: Insufficient documentation

## 2013-04-05 DIAGNOSIS — L89321 Pressure ulcer of left buttock, stage 1: Secondary | ICD-10-CM

## 2013-04-05 MED ORDER — LEVOFLOXACIN 250 MG PO TABS: 250.0000 mg | ORAL_TABLET | Freq: Every day | ORAL | Status: DC

## 2013-04-05 MED ORDER — CEFTRIAXONE SODIUM 1 G IJ SOLR
1.0000 g | Freq: Once | INTRAMUSCULAR | Status: AC
  Administered 2013-04-05: 1 g via INTRAMUSCULAR

## 2013-04-05 MED ORDER — HYDROCODONE-HOMATROPINE 5-1.5 MG/5ML PO SYRP: 5.0000 mL | ORAL_SOLUTION | Freq: Four times a day (QID) | ORAL | Status: DC | PRN

## 2013-04-05 MED ORDER — METHYLPREDNISOLONE ACETATE 80 MG/ML IJ SUSP
80.0000 mg | Freq: Once | INTRAMUSCULAR | Status: AC
  Administered 2013-04-05: 80 mg via INTRAMUSCULAR

## 2013-04-05 NOTE — Patient Instructions (Signed)
You had the flu shot, antibiotic (rocephin), and steroid shot today Please take all new medication as prescribed - the pill antibiotic, cough medicine as needed Please go to the XRAY Department in the Basement (go straight as you get off the elevator) for the x-ray testing You will be contacted by phone if any changes need to be made immediately.  Otherwise, you will receive a letter about your results with an explanation, but please check with MyChart first.  Please remember to sign up for My Chart if you have not done so, as this will be important to you in the future with finding out test results, communicating by private email, and scheduling acute appointments online when needed.

## 2013-04-05 NOTE — Assessment & Plan Note (Signed)
stable overall by history and exam, recent data reviewed with pt, and pt to continue medical treatment as before,  to f/u any worsening symptoms or concerns BP Readings from Last 3 Encounters:  04/05/13 120/70  02/22/13 120/88  02/15/13 130/60

## 2013-04-05 NOTE — Assessment & Plan Note (Signed)
Erythema only, no skin breakdown, for duoderm asd,

## 2013-04-05 NOTE — Progress Notes (Addendum)
Subjective:    Patient ID: James Mcneil, male    DOB: Feb 25, 1923, 79 y.o.   MRN: [id number]  HPI  Here with family, with 2 wks onset gradually feeling weaker, with prod cough, sob and feverish. No falls, and Pt denies chest pain, orthopnea, PND, increased LE swelling, palpitations, dizziness or syncope.  Long hx of tobacco, and hx of COPD.  No other recent pna, illness or malignancy, had stroke 2001 with persistent right side weakness but no recent worsening choking, gagging or dysphagia.  Also has new onset left buttock erythema ? Sore, as he has been sleeping in a lounge chair at night Past Medical History  Diagnosis Date  . APHASIA, LE CEREBROVASCULAR DISEASE 2002    expressive  . HYPERGLYCEMIA   . HYPERLIPIDEMIA NEC/NOS   . HYPERTENSION   . Stroke 2002    residual R>LUE and mild exp aphasia  . COPD    Past Surgical History  Procedure Laterality Date  . No past surgeries      reports that he has quit smoking. His smoking use included Cigarettes. He smoked 0.00 packs per day. He does not have any smokeless tobacco history on file. He reports that he does not drink alcohol or use illicit drugs. family history is not on file. No Known Allergies Current Outpatient Prescriptions on File Prior to Visit  Medication Sig Dispense Refill  . aspirin 81 MG tablet Take 81 mg by mouth daily.        . hydrochlorothiazide (MICROZIDE) 12.5 MG capsule Take 1 capsule (12.5 mg total) by mouth every morning.  90 capsule  3  . metoprolol tartrate (LOPRESSOR) 25 MG tablet TAKE ONE TABLET BY MOUTH TWICE DAILY  180 tablet  0  . Omega-3 Fatty Acids (OMEGA-3 FISH OIL) 1200 MG CAPS Take 1,200 mg by mouth daily.        . pantoprazole (PROTONIX) 40 MG tablet Take 1 tablet (40 mg total) by mouth daily.  90 tablet  3  . simvastatin (ZOCOR) 40 MG tablet Take 1 tablet (40 mg total) by mouth at bedtime.  90 tablet  3  . tiotropium (SPIRIVA HANDIHALER) 18 MCG inhalation capsule Place 1 capsule (18 mcg total) into  inhaler and inhale daily.  90 capsule  3  . tiZANidine (ZANAFLEX) 4 MG tablet Take 1 tablet (4 mg total) by mouth every 8 (eight) hours as needed (muscle spasm).  90 tablet  1  . traMADol (ULTRAM) 50 MG tablet Take 1 tablet (50 mg total) by mouth every 8 (eight) hours as needed for pain.  90 tablet  1  . acetaminophen (TYLENOL) 325 MG tablet Take 2 tablets (650 mg total) by mouth 2 (two) times daily at 10 AM and 5 PM.      . albuterol (PROVENTIL HFA;VENTOLIN HFA) 108 (90 BASE) MCG/ACT inhaler Inhale 2 puffs into the lungs every 6 (six) hours as needed for wheezing.  1 Inhaler  0   No current facility-administered medications on file prior to visit.    Review of Systems  Constitutional: Negative for unexpected weight change, or unusual diaphoresis  HENT: Negative for tinnitus.   Eyes: Negative for photophobia and visual disturbance.  Respiratory: Negative for choking and stridor.   Gastrointestinal: Negative for vomiting and blood in stool.  Genitourinary: Negative for hematuria and decreased urine volume.  Musculoskeletal: Negative for acute joint swelling Skin: Negative for color change and wound.  Neurological: Negative for tremors and numbness other than noted  Psychiatric/Behavioral: Negative for decreased  concentration or  hyperactivity.       Objective:   Physical Exam BP 120/70  Pulse 93  Temp(Src) 97.5 F (36.4 C) (Oral)  Wt 126 lb (57.153 kg)  BMI 22.33 kg/m2  SpO2 94% VS noted, mild ill Constitutional: Pt appears well-developed and well-nourished.  HENT: Head: NCAT.  Right Ear: External ear normal.  Left Ear: External ear normal.  Bilat tm's with mild erythema.  Max sinus areas non tender.  Pharynx with mild erythema, no exudate Eyes: Conjunctivae and EOM are normal. Pupils are equal, round, and reactive to light.  Neck: Normal range of motion. Neck supple.  Cardiovascular: Normal rate and regular rhythm.   Pulmonary/Chest: Effort normal and breath sounds decreased,  rare wheeze and few Left sided rales mid lung.  Neurological: Pt is alert. Not confused  Skin: Skin is warm. No erythema. except for left buttock with 2x2 cm area mild erythema, no ulcer or swelling Psychiatric: Pt behavior is normal. Thought content normal.     Assessment & Plan:

## 2013-04-05 NOTE — Assessment & Plan Note (Signed)
Most likely acute bronchitis vs pna; for cxr, also rocephin 1 gm IM now, then oral antibx, cough med for home

## 2013-04-05 NOTE — Assessment & Plan Note (Signed)
Possible mild exac, has steadfastly refused his proventil and spiriva at home;  For depomedrol IM x 1 now

## 2013-04-11 ENCOUNTER — Encounter: Payer: Self-pay | Admitting: Internal Medicine

## 2013-04-11 ENCOUNTER — Ambulatory Visit (INDEPENDENT_AMBULATORY_CARE_PROVIDER_SITE_OTHER): Payer: Medicare Other | Admitting: Internal Medicine

## 2013-04-11 VITALS — BP 110/60 | HR 84 | Temp 97.8°F | Wt 125.8 lb

## 2013-04-11 DIAGNOSIS — R634 Abnormal weight loss: Secondary | ICD-10-CM

## 2013-04-11 DIAGNOSIS — J441 Chronic obstructive pulmonary disease with (acute) exacerbation: Secondary | ICD-10-CM

## 2013-04-11 DIAGNOSIS — R627 Adult failure to thrive: Secondary | ICD-10-CM

## 2013-04-11 MED ORDER — GUAIFENESIN 100 MG/5ML PO SOLN: 5.0000 mL | Freq: Four times a day (QID) | ORAL | Status: DC

## 2013-04-11 NOTE — Patient Instructions (Signed)
It was good to see you today. We have reviewed your prior records including labs and tests today Complete the antibiotics as prescribed, take over-the-counter Robitussin syrup 4 times daily for one week, then twice daily until this No other changes recommended Flu shot updated today If you are unable to be your will as provided, and drink a can of Ensure or Boost for caloric intake followup in October as planned, call sooner if worse

## 2013-04-11 NOTE — Progress Notes (Signed)
Subjective:    Patient ID: James Mcneil, male    DOB: 1922-11-28, 79 y.o.   MRN: [id number]  HPI patient is here for follow up  -cough Also FTT with weight loss since compression fx  Past Medical History  Diagnosis Date  . APHASIA, LE CEREBROVASCULAR DISEASE 2002    expressive  . HYPERGLYCEMIA   . HYPERLIPIDEMIA NEC/NOS   . HYPERTENSION   . Stroke 2002    residual R>LUE and mild exp aphasia  . COPD     Review of Systems  Constitutional: Positive for appetite change and unexpected weight change. Negative for fever and fatigue.  HENT: Positive for trouble swallowing ("big pills or bad food") and postnasal drip. Negative for sore throat, drooling, dental problem and voice change.   Respiratory: Positive for cough ("phlegm in throat"). Negative for chest tightness, shortness of breath and wheezing.   Cardiovascular: Negative for chest pain and leg swelling.  Gastrointestinal: Negative for abdominal pain, diarrhea and constipation.  Musculoskeletal: Positive for back pain (hard getting out of chair from seated position). Negative for joint swelling.  Neurological: Negative for seizures, weakness and headaches.        Objective:   Physical Exam BP 110/60  Pulse 84  Temp(Src) 97.8 F (36.6 C) (Oral)  Wt 125 lb 12.8 oz (57.063 kg)  BMI 22.29 kg/m2  SpO2 97% Wt Readings from Last 3 Encounters:  04/11/13 125 lb 12.8 oz (57.063 kg)  04/05/13 126 lb (57.153 kg)  02/22/13 137 lb 1.9 oz (62.197 kg)   General:  no acute distress; cooperative throughout examination; stigmata of CVA RUE & dysarthria - dtr x 2 at side Lungs:  normal respiratory effort, no intercostal retractions or use of accessory muscles; normal breath sounds bilaterally - no crackles and no wheezes.   Throat clearing efforts with dry cough Heart:  normal rate, regular rhythm, no murmur, and no rub. LLE without edema, RLE with trace swelling (chronic). Neurologic:  mod dysarthria and word finding difficulty -  alert & oriented X3 and cranial nerves II-XII symetrically intact.  RUE with 4+/5 strength - mild HP. gait actatic but walks unaided. follows commands with good comprehension.      Lab Results  Component Value Date   WBC 7.8 11/08/2012   HGB 13.3 11/08/2012   HCT 40.2 11/08/2012   PLT 292.0 11/08/2012   CHOL 112 11/08/2012   TRIG 50.0 11/08/2012   HDL 50.00 11/08/2012   ALT 18 11/08/2012   AST 27 11/08/2012   NA 136 11/08/2012   K 4.4 11/08/2012   CL 98 11/08/2012   CREATININE 1.0 11/08/2012   BUN 22 11/08/2012   CO2 31 11/08/2012   TSH 2.28 11/08/2012   HGBA1C 6.1 11/08/2012   Lab Results  Component Value Date   VITAMINB12 261 09/30/2011   Dg Lumbar Spine 2-3 Views  02/15/2013   *RADIOLOGY REPORT*  Clinical Data: Pain post fall.  LUMBAR SPINE - 2-3 VIEW  Comparison: 09/02/2011  Findings: Progressive compression fracture deformity of L3 with greater than 50 % loss of height centrally.  No definite retropulsion or posterior element involvement.  Anterior endplate spurring at all lumbar levels.  Normal alignment.  Patchy aortic calcifications without suggestion of aneurysm.  IMPRESSION:  1.  Progressive L3 compression fracture deformity since previous exam. Per CMS PQRS reporting requirements (PQRS Measure 24): Given the patient's age of greater than 50 and the fracture site (hip, distal radius, or spine), the patient should be tested for osteoporosis using DXA,  and the appropriate treatment considered based on the DXA results.   Original Report Authenticated By: D. Andria Rhein, MD   Dg Chest 2 View  04/05/2013   CLINICAL DATA:  Cough, fever and shortness of Breath for 2 weeks.  EXAM: CHEST  2 VIEW  COMPARISON:  CT chest 08/2011 and PA and lateral chest 09/02/2011.  FINDINGS: The lungs are emphysematous. Basilar scarring is again seen, worse on the left. No consolidative process, pneumothorax or effusion is identified. Heart size is normal. Since the prior study, the patient has suffered a severe  compression fracture deformity at the thoracolumbar junction. Although age indeterminate, it appears remote. Remote superior endplate compression fracture in the upper lumbar spine is also noted and seen on the prior exam.  IMPRESSION: 1. No acute cardiopulmonary disease. 2. Emphysema. 3. Compression fracture deformity lower thoracic spine cannot be definitively characterized but appears remote. It is new since the comparison examination.   Electronically Signed   By: Drusilla Kanner M.D.   On: 04/05/2013 15:55   Assessment & Plan:   Cough, chronic phlegm in the setting of chronic bronchitis Recent COPD exacerbation last week reviewed, improved with IM steroids and antibiotics as ongoing. Chest x-ray reviewed and unremarkable for acute problem.  Recommend addition of Robitussin in place of Mucinex and continue Spiriva inhaler as previously prescribed  Failure to thrive with unintentional weight loss. Progressive in past 6 weeks since fall with subsequent compression fracture. Increasing debility with advanced age. Encouraged intake of boost or Ensure supplements 4 caloric intake. Discussed possibility of upper endoscopy evaluation for "dysphasia" but patient wishes to treat "mucus" first... See above Followup in 6 weeks as scheduled, patient and family to call sooner if problems

## 2013-05-10 ENCOUNTER — Other Ambulatory Visit (INDEPENDENT_AMBULATORY_CARE_PROVIDER_SITE_OTHER): Payer: Medicare Other

## 2013-05-10 ENCOUNTER — Encounter: Payer: Self-pay | Admitting: Internal Medicine

## 2013-05-10 ENCOUNTER — Ambulatory Visit (INDEPENDENT_AMBULATORY_CARE_PROVIDER_SITE_OTHER): Payer: Medicare Other | Admitting: Internal Medicine

## 2013-05-10 VITALS — BP 112/72 | HR 66 | Temp 97.9°F | Wt 123.8 lb

## 2013-05-10 DIAGNOSIS — R627 Adult failure to thrive: Secondary | ICD-10-CM

## 2013-05-10 DIAGNOSIS — J449 Chronic obstructive pulmonary disease, unspecified: Secondary | ICD-10-CM

## 2013-05-10 DIAGNOSIS — Z23 Encounter for immunization: Secondary | ICD-10-CM

## 2013-05-10 DIAGNOSIS — R634 Abnormal weight loss: Secondary | ICD-10-CM

## 2013-05-10 DIAGNOSIS — E785 Hyperlipidemia, unspecified: Secondary | ICD-10-CM

## 2013-05-10 LAB — CBC WITH DIFFERENTIAL/PLATELET
Basophils Absolute: 0 K/uL (ref 0.0–0.1)
Basophils Relative: 0.6 % (ref 0.0–3.0)
Eosinophils Absolute: 0.1 K/uL (ref 0.0–0.7)
Eosinophils Relative: 1.5 % (ref 0.0–5.0)
HCT: 38.9 % — ABNORMAL LOW (ref 39.0–52.0)
Hemoglobin: 13.2 g/dL (ref 13.0–17.0)
Lymphocytes Relative: 22.1 % (ref 12.0–46.0)
Lymphs Abs: 1.6 K/uL (ref 0.7–4.0)
MCHC: 33.8 g/dL (ref 30.0–36.0)
MCV: 98.4 fl (ref 78.0–100.0)
Monocytes Absolute: 0.6 K/uL (ref 0.1–1.0)
Monocytes Relative: 8.1 % (ref 3.0–12.0)
Neutro Abs: 4.9 K/uL (ref 1.4–7.7)
Neutrophils Relative %: 67.7 % (ref 43.0–77.0)
Platelets: 244 K/uL (ref 150.0–400.0)
RBC: 3.95 Mil/uL — ABNORMAL LOW (ref 4.22–5.81)
RDW: 14.9 % — ABNORMAL HIGH (ref 11.5–14.6)
WBC: 7.2 K/uL (ref 4.5–10.5)

## 2013-05-10 LAB — HEPATIC FUNCTION PANEL
ALT: 13 U/L (ref 0–53)
AST: 27 U/L (ref 0–37)
Albumin: 3.7 g/dL (ref 3.5–5.2)
Alkaline Phosphatase: 95 U/L (ref 39–117)
Bilirubin, Direct: 0.2 mg/dL (ref 0.0–0.3)
Total Bilirubin: 1 mg/dL (ref 0.3–1.2)
Total Protein: 7.1 g/dL (ref 6.0–8.3)

## 2013-05-10 LAB — LIPID PANEL
Cholesterol: 123 mg/dL (ref 0–200)
Total CHOL/HDL Ratio: 2
Triglycerides: 55 mg/dL (ref 0.0–149.0)
VLDL: 11 mg/dL (ref 0.0–40.0)

## 2013-05-10 LAB — BASIC METABOLIC PANEL
BUN: 19 mg/dL (ref 6–23)
CO2: 31 mEq/L (ref 19–32)
Calcium: 9.7 mg/dL (ref 8.4–10.5)
Creatinine, Ser: 0.9 mg/dL (ref 0.4–1.5)

## 2013-05-10 LAB — TSH: TSH: 1.8 u[IU]/mL (ref 0.35–5.50)

## 2013-05-10 MED ORDER — MIRTAZAPINE 15 MG PO TABS: 7.5000 mg | ORAL_TABLET | Freq: Every day | ORAL | Status: DC

## 2013-05-10 NOTE — Patient Instructions (Signed)
It was good to see you today.  We have reviewed your prior records including labs and tests today  Test(s) ordered today. Your results will be released to MyChart (or called to you) after review, usually within 72hours after test completion. If any changes need to be made, you will be notified at that same time.  Medications reviewed and updated -reduce simvastatin to 20 mg daily (1/2 40mg  tab) and start Remeron 7.5 mg every night (1/2 of 15mg  tab) - no other changes recommended at this time.  Your prescription(s) have been submitted to your pharmacy. Please take as directed and contact our office if you believe you are having problem(s) with the medication(s).  Please schedule followup in 3 months for weight recheck, call sooner if problems.

## 2013-05-10 NOTE — Assessment & Plan Note (Addendum)
Patient with failure to thrive since compression fracture summer 2014 Decreased appetite despite use of Boost supplementation Recheck screening labs Family hesitant to pursue aggressive interventions or workup given advanced age Will add Remeron today for appetite stimulation

## 2013-05-10 NOTE — Assessment & Plan Note (Signed)
Tolerating statin well -  Reduce dose given FTT and check labs now -  Reviewed PVD and prior L ICA stent in 2002

## 2013-05-10 NOTE — Assessment & Plan Note (Addendum)
Wt Readings from Last 3 Encounters:  05/10/13 123 lb 12.8 oz (56.155 kg)  04/11/13 125 lb 12.8 oz (57.063 kg)  04/05/13 126 lb (57.153 kg)  screening labs as above Add Remeron as above

## 2013-05-10 NOTE — Progress Notes (Signed)
Subjective:    Patient ID: James Mcneil, male    DOB: March 04, 1923, 79 y.o.   MRN: [id number]  HPI  patient is here for follow up - chronic medical issues also reviewed.    FTT with weight loss (unintentional) since compression fx summer 2014.  Pt with decreased appetite.  Using Boost.  Gums sore due to wearing dentures all the time, working with dentist currently.    COPD, remote tobacco  - symptoms stable on Spiriva.  Denies shortness of breath or dyspnea on exertion.     Past Medical History  Diagnosis Date  . APHASIA, LE CEREBROVASCULAR DISEASE 2002    expressive  . HYPERGLYCEMIA   . HYPERLIPIDEMIA NEC/NOS   . HYPERTENSION   . Stroke 2002    residual R>LUE and mild exp aphasia  . COPD     Review of Systems  Constitutional: Positive for appetite change and unexpected weight change. Negative for fever and fatigue.  HENT: Positive for mouth sores, postnasal drip and trouble swallowing ("big pills or bad food"). Negative for dental problem, drooling, sore throat and voice change.   Respiratory: Positive for cough ("phlegm in throat"). Negative for chest tightness, shortness of breath and wheezing.   Cardiovascular: Negative for chest pain and leg swelling.  Gastrointestinal: Negative for abdominal pain, diarrhea and constipation.  Musculoskeletal: Positive for back pain (hard getting out of chair from seated position). Negative for joint swelling.  Neurological: Negative for seizures, weakness and headaches.        Objective:   Physical Exam  BP 112/72  Pulse 66  Temp(Src) 97.9 F (36.6 C) (Oral)  Wt 123 lb 12.8 oz (56.155 kg)  BMI 21.94 kg/m2  SpO2 96% Wt Readings from Last 3 Encounters:  05/10/13 123 lb 12.8 oz (56.155 kg)  04/11/13 125 lb 12.8 oz (57.063 kg)  04/05/13 126 lb (57.153 kg)   General:  no acute distress; cooperative throughout examination; stigmata of CVA RUE & dysarthria - dtr x 2 at side Lungs:  normal respiratory effort, no intercostal  retractions or use of accessory muscles; normal breath sounds bilaterally - no crackles and no wheezes.  Heart:  normal rate, regular rhythm, no murmur, and no rub. LLE without edema, RLE with trace swelling (chronic). Neurologic:  mod dysarthria and word finding difficulty - alert & oriented X3 and cranial nerves II-XII symetrically intact.  RUE with 4+/5 strength - mild HP. gait actatic but walks unaided. follows commands with good comprehension.      Lab Results  Component Value Date   WBC 7.8 11/08/2012   HGB 13.3 11/08/2012   HCT 40.2 11/08/2012   PLT 292.0 11/08/2012   CHOL 112 11/08/2012   TRIG 50.0 11/08/2012   HDL 50.00 11/08/2012   ALT 18 11/08/2012   AST 27 11/08/2012   NA 136 11/08/2012   K 4.4 11/08/2012   CL 98 11/08/2012   CREATININE 1.0 11/08/2012   BUN 22 11/08/2012   CO2 31 11/08/2012   TSH 2.28 11/08/2012   HGBA1C 6.1 11/08/2012   Lab Results  Component Value Date   VITAMINB12 261 09/30/2011   Dg Lumbar Spine 2-3 Views  02/15/2013   *RADIOLOGY REPORT*  Clinical Data: Pain post fall.  LUMBAR SPINE - 2-3 VIEW  Comparison: 09/02/2011  Findings: Progressive compression fracture deformity of L3 with greater than 50 % loss of height centrally.  No definite retropulsion or posterior element involvement.  Anterior endplate spurring at all lumbar levels.  Normal alignment.  Patchy aortic  calcifications without suggestion of aneurysm.  IMPRESSION:  1.  Progressive L3 compression fracture deformity since previous exam. Per CMS PQRS reporting requirements (PQRS Measure 24): Given the patient's age of greater than 50 and the fracture site (hip, distal radius, or spine), the patient should be tested for osteoporosis using DXA, and the appropriate treatment considered based on the DXA results.   Original Report Authenticated By: D. Andria Rhein, MD   Dg Chest 2 View  04/05/2013   CLINICAL DATA:  Cough, fever and shortness of Breath for 2 weeks.  EXAM: CHEST  2 VIEW  COMPARISON:  CT chest 08/2011  and PA and lateral chest 09/02/2011.  FINDINGS: The lungs are emphysematous. Basilar scarring is again seen, worse on the left. No consolidative process, pneumothorax or effusion is identified. Heart size is normal. Since the prior study, the patient has suffered a severe compression fracture deformity at the thoracolumbar junction. Although age indeterminate, it appears remote. Remote superior endplate compression fracture in the upper lumbar spine is also noted and seen on the prior exam.  IMPRESSION: 1. No acute cardiopulmonary disease. 2. Emphysema. 3. Compression fracture deformity lower thoracic spine cannot be definitively characterized but appears remote. It is new since the comparison examination.   Electronically Signed   By: Drusilla Kanner M.D.   On: 04/05/2013 15:55   Assessment & Plan:   See problem list. Medications and labs reviewed today.

## 2013-05-10 NOTE — Assessment & Plan Note (Addendum)
Chronic dry cough, former smoker Added spiriva 12/2011 - pt reports compliance with current treatment. No dyspnea on exertion or wheezing.   Current treatment plan effective.  Continue same.

## 2013-05-10 NOTE — Progress Notes (Signed)
Pre-visit discussion using our clinic review tool. No additional management support is needed unless otherwise documented below in the visit note.  

## 2013-05-18 ENCOUNTER — Other Ambulatory Visit: Payer: Self-pay | Admitting: Internal Medicine

## 2013-06-22 ENCOUNTER — Encounter: Payer: Self-pay | Admitting: Cardiology

## 2013-06-22 ENCOUNTER — Ambulatory Visit (HOSPITAL_COMMUNITY): Payer: Medicare Other | Attending: Cardiology

## 2013-06-22 DIAGNOSIS — I771 Stricture of artery: Secondary | ICD-10-CM | POA: Insufficient documentation

## 2013-06-22 DIAGNOSIS — J4489 Other specified chronic obstructive pulmonary disease: Secondary | ICD-10-CM | POA: Insufficient documentation

## 2013-06-22 DIAGNOSIS — J449 Chronic obstructive pulmonary disease, unspecified: Secondary | ICD-10-CM | POA: Insufficient documentation

## 2013-06-22 DIAGNOSIS — I6529 Occlusion and stenosis of unspecified carotid artery: Secondary | ICD-10-CM | POA: Insufficient documentation

## 2013-06-22 DIAGNOSIS — R279 Unspecified lack of coordination: Secondary | ICD-10-CM | POA: Insufficient documentation

## 2013-06-22 DIAGNOSIS — Z87891 Personal history of nicotine dependence: Secondary | ICD-10-CM | POA: Insufficient documentation

## 2013-06-22 DIAGNOSIS — R4701 Aphasia: Secondary | ICD-10-CM | POA: Insufficient documentation

## 2013-06-22 DIAGNOSIS — I658 Occlusion and stenosis of other precerebral arteries: Secondary | ICD-10-CM | POA: Insufficient documentation

## 2013-06-22 DIAGNOSIS — Z8673 Personal history of transient ischemic attack (TIA), and cerebral infarction without residual deficits: Secondary | ICD-10-CM | POA: Insufficient documentation

## 2013-06-22 DIAGNOSIS — I10 Essential (primary) hypertension: Secondary | ICD-10-CM | POA: Insufficient documentation

## 2013-06-22 DIAGNOSIS — E785 Hyperlipidemia, unspecified: Secondary | ICD-10-CM | POA: Insufficient documentation

## 2013-06-23 ENCOUNTER — Encounter: Payer: Self-pay | Admitting: Internal Medicine

## 2013-06-23 ENCOUNTER — Ambulatory Visit (INDEPENDENT_AMBULATORY_CARE_PROVIDER_SITE_OTHER): Payer: Medicare Other | Admitting: Internal Medicine

## 2013-06-23 ENCOUNTER — Ambulatory Visit (HOSPITAL_COMMUNITY): Payer: Medicare Other | Attending: Internal Medicine

## 2013-06-23 VITALS — BP 118/60 | HR 65 | Temp 97.8°F | Wt 130.8 lb

## 2013-06-23 DIAGNOSIS — Z87891 Personal history of nicotine dependence: Secondary | ICD-10-CM | POA: Insufficient documentation

## 2013-06-23 DIAGNOSIS — J4489 Other specified chronic obstructive pulmonary disease: Secondary | ICD-10-CM | POA: Insufficient documentation

## 2013-06-23 DIAGNOSIS — E785 Hyperlipidemia, unspecified: Secondary | ICD-10-CM | POA: Insufficient documentation

## 2013-06-23 DIAGNOSIS — M7989 Other specified soft tissue disorders: Secondary | ICD-10-CM

## 2013-06-23 DIAGNOSIS — R627 Adult failure to thrive: Secondary | ICD-10-CM

## 2013-06-23 DIAGNOSIS — Z8673 Personal history of transient ischemic attack (TIA), and cerebral infarction without residual deficits: Secondary | ICD-10-CM | POA: Insufficient documentation

## 2013-06-23 DIAGNOSIS — I10 Essential (primary) hypertension: Secondary | ICD-10-CM | POA: Insufficient documentation

## 2013-06-23 DIAGNOSIS — J449 Chronic obstructive pulmonary disease, unspecified: Secondary | ICD-10-CM | POA: Insufficient documentation

## 2013-06-23 MED ORDER — FUROSEMIDE 20 MG PO TABS: 20.0000 mg | ORAL_TABLET | Freq: Every day | ORAL | Status: DC

## 2013-06-23 NOTE — Progress Notes (Signed)
Pre-visit discussion using our clinic review tool. No additional management support is needed unless otherwise documented below in the visit note.  

## 2013-06-23 NOTE — Patient Instructions (Signed)
It was good to see you today.  We have reviewed your prior records including labs and tests today  Will schedule for Doppler to evaluate blood flow on right leg and rule out blood clot -  Stop hydrochlorothiazide and begin Lasix 20 mg daily for fluid  Your prescription(s) have been submitted to your pharmacy. Please take as directed and contact our office if you believe you are having problem(s) with the medication(s).  No other changes recommended at this time

## 2013-06-23 NOTE — Progress Notes (Signed)
  Subjective:    Patient ID: James Mcneil, male    DOB: 13-Apr-1923, 79 y.o.   MRN: [id number]  HPI patient is here for follow up - chronic medical issues also reviewed.    FTT with weight loss (unintentional) since compression fx summer 2014.  Pt with decreased appetite.  Using Boost.  Gums sore due to wearing dentures all the time, working with dentist currently.    COPD, remote tobacco  - symptoms stable on Spiriva.  Denies shortness of breath or dyspnea on exertion.     Past Medical History  Diagnosis Date  . APHASIA, LE CEREBROVASCULAR DISEASE 2002    expressive  . HYPERGLYCEMIA   . HYPERLIPIDEMIA NEC/NOS   . HYPERTENSION   . Stroke 2002    residual R>LUE and mild exp aphasia  . COPD     Review of Systems  Constitutional: Negative for fever and unexpected weight change.  Cardiovascular: Positive for leg swelling (right). Negative for chest pain and palpitations.  Musculoskeletal: Positive for back pain (chronic). Negative for joint swelling.  Psychiatric/Behavioral: Positive for confusion (per dtr). Negative for dysphoric mood, decreased concentration and agitation.        Objective:   Physical Exam BP 118/60  Pulse 65  Temp(Src) 97.8 F (36.6 C) (Oral)  Wt 130 lb 12.8 oz (59.33 kg)  SpO2 92% Wt Readings from Last 3 Encounters:  06/23/13 130 lb 12.8 oz (59.33 kg)  05/10/13 123 lb 12.8 oz (56.155 kg)  04/11/13 125 lb 12.8 oz (57.063 kg)   General:  no acute distress; cooperative throughout examination; stigmata of CVA RUE & dysarthria - dtr x 2 at side Lungs:  normal respiratory effort, no intercostal retractions or use of accessory muscles; normal breath sounds bilaterally - no crackles and no wheezes.  Heart:  normal rate, regular rhythm, no murmur, and no rub. LLE without edema, RLE with 1+ swelling (more than usual chronic state). Neurologic:  mod dysarthria and word finding difficulty - alert & oriented X3 and cranial nerves II-XII symetrically intact.   RUE with 4+/5 strength - mild HP. gait actatic but walks unaided. follows commands with good comprehension.  Skin: no erythema or ulceration of RLE     Lab Results  Component Value Date   WBC 7.2 05/10/2013   HGB 13.2 05/10/2013   HCT 38.9* 05/10/2013   PLT 244.0 05/10/2013   CHOL 123 05/10/2013   TRIG 55.0 05/10/2013   HDL 59.90 05/10/2013   ALT 13 05/10/2013   AST 27 05/10/2013   NA 140 05/10/2013   K 5.2* 05/10/2013   CL 100 05/10/2013   CREATININE 0.9 05/10/2013   BUN 19 05/10/2013   CO2 31 05/10/2013   TSH 1.80 05/10/2013   HGBA1C 6.1 11/08/2012   Lab Results  Component Value Date   VITAMINB12 261 09/30/2011      Assessment & Plan:   Right leg/calf swelling - acute on chronic Clinically, no evidence for cellulitis -Also no evidence for CHF Check Doppler rule out DVT Change hydrochlorothiazide to furosemide for edema  See problem list. Medications and labs reviewed today.

## 2013-06-23 NOTE — Assessment & Plan Note (Signed)
Patient with failure to thrive since compression fracture summer 2014 Decreased appetite despite use of Boost supplementation Family hesitant to pursue aggressive interventions or workup given advanced age added Remeron 04/2013 for appetite stimulation  Support offered to family at length today

## 2013-07-06 ENCOUNTER — Other Ambulatory Visit: Payer: Self-pay | Admitting: *Deleted

## 2013-07-06 MED ORDER — METOPROLOL TARTRATE 25 MG PO TABS: ORAL_TABLET | ORAL | Status: DC

## 2013-07-24 ENCOUNTER — Telehealth: Payer: Self-pay | Admitting: *Deleted

## 2013-07-24 NOTE — Telephone Encounter (Signed)
Ok to generate and i will sign

## 2013-07-24 NOTE — Telephone Encounter (Signed)
Called requesting letter excusing from [employer].  Pt is to report on 2.9.15.  Juror # [phone]; [place] [facility] [place]  Please advise

## 2013-07-25 ENCOUNTER — Other Ambulatory Visit: Payer: Self-pay | Admitting: Internal Medicine

## 2013-07-25 ENCOUNTER — Encounter: Payer: Self-pay | Admitting: *Deleted

## 2013-07-25 NOTE — Telephone Encounter (Signed)
Left message with pts wife advised letter ready for pick up

## 2013-08-14 ENCOUNTER — Encounter: Payer: Self-pay | Admitting: Internal Medicine

## 2013-08-14 ENCOUNTER — Ambulatory Visit (INDEPENDENT_AMBULATORY_CARE_PROVIDER_SITE_OTHER): Payer: Medicare Other | Admitting: Internal Medicine

## 2013-08-14 VITALS — BP 112/70 | HR 78 | Temp 97.0°F | Wt 131.4 lb

## 2013-08-14 DIAGNOSIS — R627 Adult failure to thrive: Secondary | ICD-10-CM

## 2013-08-14 DIAGNOSIS — F0390 Unspecified dementia without behavioral disturbance: Secondary | ICD-10-CM

## 2013-08-14 DIAGNOSIS — R609 Edema, unspecified: Secondary | ICD-10-CM

## 2013-08-14 MED ORDER — DONEPEZIL HCL 5 MG PO TABS: 5.0000 mg | ORAL_TABLET | Freq: Every day | ORAL | Status: DC

## 2013-08-14 NOTE — Progress Notes (Signed)
  Subjective:    Patient ID: James RedWilliam R Mcneil, male    DOB: 10/01/1922, 80 y.o.   MRN: [id number]  HPI  patient is here for follow up - chronic medical issues also reviewed.    FTT with weight loss (unintentional) since compression fx summer 2014.  Pt with decreased appetite.  Using Boost.  Gums sore due to wearing dentures all the time, working with dentist currently.    COPD, remote tobacco  - symptoms stable on Spiriva.  Denies shortness of breath or dyspnea on exertion.   Family concerned with forgetfulness - poor choices and behavioral impulse control re: financial decisions    Past Medical History  Diagnosis Date  . APHASIA, LE CEREBROVASCULAR DISEASE 2002    expressive  . HYPERGLYCEMIA   . HYPERLIPIDEMIA NEC/NOS   . HYPERTENSION   . Stroke 2002    residual R>LUE and mild exp aphasia  . COPD     Review of Systems  Constitutional: Negative for fever and unexpected weight change.  Cardiovascular: Positive for leg swelling (right). Negative for chest pain and palpitations.  Musculoskeletal: Positive for back pain (chronic). Negative for joint swelling.  Psychiatric/Behavioral: Positive for confusion (per dtr). Negative for dysphoric mood, decreased concentration and agitation.        Objective:   Physical Exam BP 112/70  Pulse 78  Temp(Src) 97 F (36.1 C) (Oral)  Wt 131 lb 6.4 oz (59.603 kg)  SpO2 97% Wt Readings from Last 3 Encounters:  08/14/13 131 lb 6.4 oz (59.603 kg)  06/23/13 130 lb 12.8 oz (59.33 kg)  05/10/13 123 lb 12.8 oz (56.155 kg)   General:  no acute distress; cooperative throughout examination; stigmata of CVA RUE & dysarthria - dtr x 2 at side Lungs:  normal respiratory effort, no intercostal retractions or use of accessory muscles; normal breath sounds bilaterally - no crackles and no wheezes.  Heart:  normal rate, regular rhythm, no murmur, and no rub. LLE without edema, RLE with 1+ swelling (more than usual chronic state). Neurologic:  mod  dysarthria and word finding difficulty - alert & oriented X3 and cranial nerves II-XII symetrically intact.  RUE with 4+/5 strength - mild HP. gait actatic but walks unaided. follows commands with good comprehension.  Skin: no erythema or ulceration of RLE     Lab Results  Component Value Date   WBC 7.2 05/10/2013   HGB 13.2 05/10/2013   HCT 38.9* 05/10/2013   PLT 244.0 05/10/2013   CHOL 123 05/10/2013   TRIG 55.0 05/10/2013   HDL 59.90 05/10/2013   ALT 13 05/10/2013   AST 27 05/10/2013   NA 140 05/10/2013   K 5.2* 05/10/2013   CL 100 05/10/2013   CREATININE 0.9 05/10/2013   BUN 19 05/10/2013   CO2 31 05/10/2013   TSH 1.80 05/10/2013   HGBA1C 6.1 11/08/2012   Lab Results  Component Value Date   VITAMINB12 261 09/30/2011      Assessment & Plan:   Right leg/calf swelling - acute on chronic Suspect related to neurovascular edema from right hemiparesis, prior CVA deficit Clinically, no evidence for cellulitis -Also no evidence for CHF 06/2013 Doppler negative for DVT Changed hydrochlorothiazide and  furosemide for edema 06/2013 - improved but not resolved - recommended compression hose Reassurance provided  See problem list. Medications and labs reviewed today.

## 2013-08-14 NOTE — Progress Notes (Signed)
Pre-visit discussion using our clinic review tool. No additional management support is needed unless otherwise documented below in the visit note.  

## 2013-08-14 NOTE — Patient Instructions (Signed)
It was good to see you today.  We have reviewed your prior records including labs and tests today  Will start low-dose Aricept 5 mg daily at bedtime - Your prescription(s) have been submitted to your pharmacy. Please take as directed and contact our office if you believe you are having problem(s) with the medication(s).  No other changes recommended at this time  Followup in 6-12 weeks on same, please call sooner if problems

## 2013-08-14 NOTE — Addendum Note (Signed)
Addended by: Deatra JamesBRAND, LUCY M on: 08/14/2013 10:21 AM   Modules accepted: Orders

## 2013-08-14 NOTE — Assessment & Plan Note (Signed)
Patient with failure to thrive since compression fracture summer 2014 Improved appetite with Remeron (began 04/2013) and use of Boost supplementation suspect element of cognitive decline as well, ischemic and age related.  We'll begin low-dose Aricept - erx done Support offered to family at length today

## 2013-08-14 NOTE — Assessment & Plan Note (Signed)
family reports increasing behavioral problems and poor decision-making, social regarding finances Increasing forgetful behavior and repetitive conversation MMSE difficult due to expressive aphasia from prior neurologic insult Will start low-dose Aricept at family preference - we reviewed potential risk/benefit and possible side effects - pt understands and agrees to same  Continue Remeron as well as pseudodementia, symptoms thus far improved

## 2013-08-21 ENCOUNTER — Telehealth: Payer: Self-pay | Admitting: *Deleted

## 2013-08-21 NOTE — Telephone Encounter (Signed)
Malachi BondsGloria phoned asking if something else in place of newly prescribed aricept could be prescribed, or if pt needed to come in for f/u.  Educated her that it usually takes a 2-4 of weeks for medication to become therapeutic.  She will call back for f/u as needed.

## 2013-09-07 ENCOUNTER — Other Ambulatory Visit: Payer: Self-pay | Admitting: Internal Medicine

## 2013-10-16 ENCOUNTER — Other Ambulatory Visit: Payer: Self-pay | Admitting: Internal Medicine

## 2013-11-13 ENCOUNTER — Encounter: Payer: Self-pay | Admitting: Internal Medicine

## 2013-11-13 ENCOUNTER — Ambulatory Visit (INDEPENDENT_AMBULATORY_CARE_PROVIDER_SITE_OTHER): Payer: Medicare Other | Admitting: Internal Medicine

## 2013-11-13 ENCOUNTER — Telehealth: Payer: Self-pay | Admitting: *Deleted

## 2013-11-13 VITALS — BP 130/62 | HR 72 | Temp 97.3°F | Wt 134.8 lb

## 2013-11-13 DIAGNOSIS — L8991 Pressure ulcer of unspecified site, stage 1: Secondary | ICD-10-CM

## 2013-11-13 DIAGNOSIS — L89301 Pressure ulcer of unspecified buttock, stage 1: Secondary | ICD-10-CM

## 2013-11-13 DIAGNOSIS — F0390 Unspecified dementia without behavioral disturbance: Secondary | ICD-10-CM

## 2013-11-13 DIAGNOSIS — L89309 Pressure ulcer of unspecified buttock, unspecified stage: Secondary | ICD-10-CM

## 2013-11-13 DIAGNOSIS — R627 Adult failure to thrive: Secondary | ICD-10-CM

## 2013-11-13 MED ORDER — MEMANTINE HCL 5 MG PO TABS: 5.0000 mg | ORAL_TABLET | Freq: Two times a day (BID) | ORAL | Status: DC

## 2013-11-13 NOTE — Telephone Encounter (Signed)
pts daughter called states the Candiss Norseamenda is too expensive for pt to afford.  Requesting a less expensive medication.  Please advise

## 2013-11-13 NOTE — Assessment & Plan Note (Signed)
Patient with failure to thrive since compression fracture summer 2014 Improved appetite with Remeron (began 04/2013) and use of Boost supplementation suspect element of cognitive decline as well, ischemic and age related.  07/2013 low-dose Aricept -pt did not note change so stopped same after 30 days Will try namenda now Support offered to family at length today

## 2013-11-13 NOTE — Patient Instructions (Signed)
It was good to see you today.  We have reviewed your prior records including labs and tests today  Will start low-dose namenda twice daily  - Your prescription(s) have been submitted to your pharmacy. Please take as directed and contact our office if you believe you are having problem(s) with the medication(s).  No other changes recommended at this time  Followup in 4 months on same, please call sooner if problems

## 2013-11-13 NOTE — Assessment & Plan Note (Signed)
Currently on R side: Erythema only, no skin breakdown, for duoderm asd Prior hx same 03/2013 resolved with home care Continue same, contact us if worsening

## 2013-11-13 NOTE — Progress Notes (Signed)
Subjective:    Patient ID: James Mcneil, male    DOB: 08/18/1922, 80 y.o.   MRN: [id number]  HPI  patient is here for follow up - chronic medical issues also reviewed.    FTT with weight loss (unintentional) since compression fx summer 2014.  Pt reports good appetite, eats well per dtrs. Not using Boost.  Gums sore due to wearing dentures all the time, working with dentist currently.    COPD, remote tobacco  - symptoms stable on Spiriva -uses prn.  Denies shortness of breath or dyspnea on exertion.   Family concerned with forgetfulness - poor choices and behavioral impulse control re: financial decisions    Past Medical History  Diagnosis Date  . APHASIA, LE CEREBROVASCULAR DISEASE 2002    expressive  . HYPERGLYCEMIA   . HYPERLIPIDEMIA NEC/NOS   . HYPERTENSION   . Stroke 2002    residual R>LUE and mild exp aphasia  . COPD     Review of Systems  Constitutional: Negative for fever and unexpected weight change.  Respiratory: Negative for cough and shortness of breath.   Cardiovascular: Positive for leg swelling (right, chronic). Negative for chest pain and palpitations.  Musculoskeletal: Positive for back pain (chronic). Negative for joint swelling.  Psychiatric/Behavioral: Positive for confusion (per dtr). Negative for dysphoric mood, decreased concentration and agitation.        Objective:   Physical Exam BP 130/62  Pulse 72  Temp(Src) 97.3 F (36.3 C) (Oral)  Wt 134 lb 12.8 oz (61.145 kg)  SpO2 90% Wt Readings from Last 3 Encounters:  11/13/13 134 lb 12.8 oz (61.145 kg)  08/14/13 131 lb 6.4 oz (59.603 kg)  06/23/13 130 lb 12.8 oz (59.33 kg)   General:  no acute distress; cooperative throughout examination; stigmata of CVA RUE & dysarthria - dtr x 2 at side Lungs:  normal respiratory effort, no intercostal retractions or use of accessory muscles; normal breath sounds bilaterally - no crackles and no wheezes.  Heart:  normal rate, regular rhythm, 2/6 syst  murmur, and no rub. LLE without edema, RLE with 1+ swelling (chronic state). Neurologic:  mod dysarthria and word finding difficulty - alert & oriented X3 and cranial nerves II-XII symetrically intact.  RUE with 4+/5 strength - mild HP. gait actatic but walks unaided. follows commands with good comprehension.  Skin: R buttock with small stage 1 pressure sore, covered with bandaid - no erythema or ulceration      Lab Results  Component Value Date   WBC 7.2 05/10/2013   HGB 13.2 05/10/2013   HCT 38.9* 05/10/2013   PLT 244.0 05/10/2013   CHOL 123 05/10/2013   TRIG 55.0 05/10/2013   HDL 59.90 05/10/2013   ALT 13 05/10/2013   AST 27 05/10/2013   NA 140 05/10/2013   K 5.2* 05/10/2013   CL 100 05/10/2013   CREATININE 0.9 05/10/2013   BUN 19 05/10/2013   CO2 31 05/10/2013   TSH 1.80 05/10/2013   HGBA1C 6.1 11/08/2012   Lab Results  Component Value Date   VITAMINB12 261 09/30/2011      Assessment & Plan:   Problem List Items Addressed This Visit   Decubitus ulcer of buttock, stage 1 - Primary     Currently on R side: Erythema only, no skin breakdown, for duoderm asd Prior hx same 03/2013 resolved with home care Continue same, contact us if worsening      Dementia     family reports increasing behavioral problems and poor decision-making,  social regarding finances Increasing forgetful behavior and repetitive conversation MMSE difficult due to expressive aphasia from prior neurologic insult trial low-dose Aricept 07/2013 at family preference, ot self DC'd Will rx namenda now - we reviewed potential risk/benefit and possible side effects - pt understands and agrees to same  Continue Remeron as well as pseudodementia, symptoms thus far improved    Relevant Medications      memantine (NAMENDA) tablet   FTT (failure to thrive) in adult     Patient with failure to thrive since compression fracture summer 2014 Improved appetite with Remeron (began 04/2013) and use of Boost  supplementation suspect element of cognitive decline as well, ischemic and age related.  07/2013 low-dose Aricept -pt did not note change so stopped same after 30 days Will try namenda now Support offered to family at length today

## 2013-11-13 NOTE — Assessment & Plan Note (Signed)
family reports increasing behavioral problems and poor decision-making, social regarding finances Increasing forgetful behavior and repetitive conversation MMSE difficult due to expressive aphasia from prior neurologic insult trial low-dose Aricept 07/2013 at family preference, ot self DC'd Will rx namenda now - we reviewed potential risk/benefit and possible side effects - pt understands and agrees to same  Continue Remeron as well as pseudodementia, symptoms thus far improved

## 2013-11-13 NOTE — Progress Notes (Signed)
Pre visit review using our clinic review tool, if applicable. No additional management support is needed unless otherwise documented below in the visit note. 

## 2013-11-14 MED ORDER — DONEPEZIL HCL 10 MG PO TABS: 10.0000 mg | ORAL_TABLET | Freq: Every day | ORAL | Status: DC

## 2013-11-14 NOTE — Telephone Encounter (Signed)
Spoke with Malachi BondsGloria advised of MDs message

## 2013-11-14 NOTE — Telephone Encounter (Signed)
Rather than namenda, I recommended resuming prior rx for Aricept at higher dose (10mg  qd) - new erx done

## 2013-12-30 ENCOUNTER — Other Ambulatory Visit: Payer: Self-pay | Admitting: Internal Medicine

## 2014-03-09 ENCOUNTER — Ambulatory Visit: Payer: Medicare Other | Admitting: Internal Medicine

## 2014-04-11 ENCOUNTER — Other Ambulatory Visit: Payer: Self-pay | Admitting: Internal Medicine

## 2014-05-07 ENCOUNTER — Ambulatory Visit (INDEPENDENT_AMBULATORY_CARE_PROVIDER_SITE_OTHER): Payer: Medicare Other | Admitting: Internal Medicine

## 2014-05-07 ENCOUNTER — Encounter: Payer: Self-pay | Admitting: Internal Medicine

## 2014-05-07 ENCOUNTER — Other Ambulatory Visit (INDEPENDENT_AMBULATORY_CARE_PROVIDER_SITE_OTHER): Payer: Medicare Other

## 2014-05-07 VITALS — BP 136/62 | HR 71 | Temp 97.6°F | Ht 63.0 in | Wt 122.0 lb

## 2014-05-07 DIAGNOSIS — F0390 Unspecified dementia without behavioral disturbance: Secondary | ICD-10-CM

## 2014-05-07 DIAGNOSIS — E785 Hyperlipidemia, unspecified: Secondary | ICD-10-CM

## 2014-05-07 DIAGNOSIS — Z23 Encounter for immunization: Secondary | ICD-10-CM

## 2014-05-07 DIAGNOSIS — R634 Abnormal weight loss: Secondary | ICD-10-CM

## 2014-05-07 DIAGNOSIS — R627 Adult failure to thrive: Secondary | ICD-10-CM

## 2014-05-07 DIAGNOSIS — I10 Essential (primary) hypertension: Secondary | ICD-10-CM

## 2014-05-07 DIAGNOSIS — Z0000 Encounter for general adult medical examination without abnormal findings: Secondary | ICD-10-CM

## 2014-05-07 LAB — BASIC METABOLIC PANEL
BUN: 23 mg/dL (ref 6–23)
CALCIUM: 9.3 mg/dL (ref 8.4–10.5)
CHLORIDE: 98 meq/L (ref 96–112)
CO2: 27 mEq/L (ref 19–32)
CREATININE: 1 mg/dL (ref 0.4–1.5)
GFR: 75.66 mL/min (ref >60.00)
Glucose, Bld: 116 mg/dL — ABNORMAL HIGH (ref 70–99)
Potassium: 3.9 mEq/L (ref 3.5–5.1)
Sodium: 140 mEq/L (ref 135–145)

## 2014-05-07 LAB — LIPID PANEL
CHOL/HDL RATIO: 3
Cholesterol: 141 mg/dL (ref 0–200)
HDL: 53.6 mg/dL (ref >39.00)
LDL Cholesterol: 79 mg/dL (ref 0–99)
NonHDL: 87.4
TRIGLYCERIDES: 42 mg/dL (ref 0.0–149.0)
VLDL: 8.4 mg/dL (ref 0.0–40.0)

## 2014-05-07 LAB — CBC WITH DIFFERENTIAL/PLATELET
Basophils Absolute: 0.1 10*3/uL (ref 0.0–0.1)
Basophils Relative: 0.7 % (ref 0.0–3.0)
EOS PCT: 2.2 % (ref 0.0–5.0)
Eosinophils Absolute: 0.2 10*3/uL (ref 0.0–0.7)
HCT: 40.8 % (ref 39.0–52.0)
Hemoglobin: 13.1 g/dL (ref 13.0–17.0)
LYMPHS PCT: 14.1 % (ref 12.0–46.0)
Lymphs Abs: 1.2 10*3/uL (ref 0.7–4.0)
MCHC: 32.2 g/dL (ref 30.0–36.0)
MCV: 95.7 fl (ref 78.0–100.0)
MONO ABS: 0.5 10*3/uL (ref 0.1–1.0)
Monocytes Relative: 6.2 % (ref 3.0–12.0)
Neutro Abs: 6.5 10*3/uL (ref 1.4–7.7)
Neutrophils Relative %: 76.8 % (ref 43.0–77.0)
PLATELETS: 343 10*3/uL (ref 150.0–400.0)
RBC: 4.27 Mil/uL (ref 4.22–5.81)
RDW: 15.4 % (ref 11.5–15.5)
WBC: 8.4 10*3/uL (ref 4.0–10.5)

## 2014-05-07 LAB — HEPATIC FUNCTION PANEL
ALT: 14 U/L (ref 0–53)
AST: 30 U/L (ref 0–37)
Albumin: 3.4 g/dL — ABNORMAL LOW (ref 3.5–5.2)
Alkaline Phosphatase: 133 U/L — ABNORMAL HIGH (ref 39–117)
Bilirubin, Direct: 0.1 mg/dL (ref 0.0–0.3)
Total Bilirubin: 0.6 mg/dL (ref 0.2–1.2)
Total Protein: 8.5 g/dL — ABNORMAL HIGH (ref 6.0–8.3)

## 2014-05-07 LAB — TSH: TSH: 4.2 u[IU]/mL (ref 0.35–4.50)

## 2014-05-07 MED ORDER — SIMVASTATIN 20 MG PO TABS: 20.0000 mg | ORAL_TABLET | Freq: Every day | ORAL | Status: DC

## 2014-05-07 MED ORDER — METOPROLOL TARTRATE 25 MG PO TABS: 25.0000 mg | ORAL_TABLET | Freq: Two times a day (BID) | ORAL | Status: AC

## 2014-05-07 MED ORDER — MIRTAZAPINE 15 MG PO TABS: 15.0000 mg | ORAL_TABLET | Freq: Every day | ORAL | Status: AC

## 2014-05-07 MED ORDER — DONEPEZIL HCL 10 MG PO TABS: 10.0000 mg | ORAL_TABLET | Freq: Every day | ORAL | Status: AC

## 2014-05-07 NOTE — Patient Instructions (Addendum)
It was good to see you today.  Your annual flu shot was given and/or updated today.  We have reviewed your prior records including labs and tests today  Health Maintenance reviewed - all recommended immunizations and age-appropriate screenings are up-to-date.  Test(s) ordered today. Your results will be released to MyChart (or called to you) after review, usually within 72hours after test completion. If any changes need to be made, you will be notified at that same time.  Medications reviewed and updated Increase Remeron to whole tab (15mg ) even evening for appetite and sleep No other changes recommended at this time.  Please schedule followup in 6 months for annual exam and labs, call sooner if problems.  Health Maintenance A healthy lifestyle and preventative care can promote health and wellness.  Maintain regular health, dental, and eye exams.  Eat a healthy diet. Foods like vegetables, fruits, whole grains, low-fat dairy products, and lean protein foods contain the nutrients you need and are low in calories. Decrease your intake of foods high in solid fats, added sugars, and salt. Get information about a proper diet from your health care provider, if necessary.  Regular physical exercise is one of the most important things you can do for your health. Most adults should get at least 150 minutes of moderate-intensity exercise (any activity that increases your heart rate and causes you to sweat) each week. In addition, most adults need muscle-strengthening exercises on 2 or more days a week.   Maintain a healthy weight. The body mass index (BMI) is a screening tool to identify possible weight problems. It provides an estimate of body fat based on height and weight. Your health care provider can find your BMI and can help you achieve or maintain a healthy weight. For males 20 years and older:  A BMI below 18.5 is considered underweight.  A BMI of 18.5 to 24.9 is normal.  A BMI of 25  to 29.9 is considered overweight.  A BMI of 30 and above is considered obese.  Maintain normal blood lipids and cholesterol by exercising and minimizing your intake of saturated fat. Eat a balanced diet with plenty of fruits and vegetables. Blood tests for lipids and cholesterol should begin at age 720 and be repeated every 5 years. If your lipid or cholesterol levels are high, you are over age 78, or you are at high risk for heart disease, you may need your cholesterol levels checked more frequently.Ongoing high lipid and cholesterol levels should be treated with medicines if diet and exercise are not working.  If you smoke, find out from your health care provider how to quit. If you do not use tobacco, do not start.  Lung cancer screening is recommended for adults aged 55-80 years who are at high risk for developing lung cancer because of a history of smoking. A yearly low-dose CT scan of the lungs is recommended for people who have at least a 30-pack-year history of smoking and are current smokers or have quit within the past 15 years. A pack year of smoking is smoking an average of 1 pack of cigarettes a day for 1 year (for example, a 30-pack-year history of smoking could mean smoking 1 pack a day for 30 years or 2 packs a day for 15 years). Yearly screening should continue until the smoker has stopped smoking for at least 15 years. Yearly screening should be stopped for people who develop a health problem that would prevent them from having lung cancer treatment.  If you choose to drink alcohol, do not have more than 2 drinks per day. One drink is considered to be 12 oz (360 mL) of beer, 5 oz (150 mL) of wine, or 1.5 oz (45 mL) of liquor.  Avoid the use of street drugs. Do not share needles with anyone. Ask for help if you need support or instructions about stopping the use of drugs.  High blood pressure causes heart disease and increases the risk of stroke. Blood pressure should be checked at  least every 1-2 years. Ongoing high blood pressure should be treated with medicines if weight loss and exercise are not effective.  If you are 6945-78 years old, ask your health care provider if you should take aspirin to prevent heart disease.  Diabetes screening involves taking a blood sample to check your fasting blood sugar level. This should be done once every 3 years after age 78 if you are at a normal weight and without risk factors for diabetes. Testing should be considered at a younger age or be carried out more frequently if you are overweight and have at least 1 risk factor for diabetes.  Colorectal cancer can be detected and often prevented. Most routine colorectal cancer screening begins at the age of 78 and continues through age 78. However, your health care provider may recommend screening at an earlier age if you have risk factors for colon cancer. On a yearly basis, your health care provider may provide home test kits to check for hidden blood in the stool. A small camera at the end of a tube may be used to directly examine the colon (sigmoidoscopy or colonoscopy) to detect the earliest forms of colorectal cancer. Talk to your health care provider about this at age 78 when routine screening begins. A direct exam of the colon should be repeated every 5-10 years through age 78, unless early forms of precancerous polyps or small growths are found.  People who are at an increased risk for hepatitis B should be screened for this virus. You are considered at high risk for hepatitis B if:  You were born in a country where hepatitis B occurs often. Talk with your health care provider about which countries are considered high risk.  Your parents were born in a high-risk country and you have not received a shot to protect against hepatitis B (hepatitis B vaccine).  You have HIV or AIDS.  You use needles to inject street drugs.  You live with, or have sex with, someone who has hepatitis  B.  You are a man who has sex with other men (MSM).  You get hemodialysis treatment.  You take certain medicines for conditions like cancer, organ transplantation, and autoimmune conditions.  Hepatitis C blood testing is recommended for all people born from [id number] through 1963 and any individual with known risk factors for hepatitis C.  Healthy men should no longer receive prostate-specific antigen (PSA) blood tests as part of routine cancer screening. Talk to your health care provider about prostate cancer screening.  Testicular cancer screening is not recommended for adolescents or adult males who have no symptoms. Screening includes self-exam, a health care provider exam, and other screening tests. Consult with your health care provider about any symptoms you have or any concerns you have about testicular cancer.  Practice safe sex. Use condoms and avoid high-risk sexual practices to reduce the spread of sexually transmitted infections (STIs).  You should be screened for STIs, including gonorrhea and chlamydia if:  You  are sexually active and are younger than 24 years.  You are older than 24 years, and your health care provider tells you that you are at risk for this type of infection.  Your sexual activity has changed since you were last screened, and you are at an increased risk for chlamydia or gonorrhea. Ask your health care provider if you are at risk.  If you are at risk of being infected with HIV, it is recommended that you take a prescription medicine daily to prevent HIV infection. This is called pre-exposure prophylaxis (PrEP). You are considered at risk if:  You are a man who has sex with other men (MSM).  You are a heterosexual man who is sexually active with multiple partners.  You take drugs by injection.  You are sexually active with a partner who has HIV.  Talk with your health care provider about whether you are at high risk of being infected with HIV. If you  choose to begin PrEP, you should first be tested for HIV. You should then be tested every 3 months for as long as you are taking PrEP.  Use sunscreen. Apply sunscreen liberally and repeatedly throughout the day. You should seek shade when your shadow is shorter than you. Protect yourself by wearing long sleeves, pants, a wide-brimmed hat, and sunglasses year round whenever you are outdoors.  Tell your health care provider of new moles or changes in moles, especially if there is a change in shape or color. Also, tell your health care provider if a mole is larger than the size of a pencil eraser.  A one-time screening for abdominal aortic aneurysm (AAA) and surgical repair of large AAAs by ultrasound is recommended for men aged 65-75 years who are current or former smokers.  Stay current with your vaccines (immunizations). Document Released: 01/02/2008 Document Revised: 07/11/2013 Document Reviewed: 12/01/2010 [facility] Patient Information 2015 [place], [place]. This information is not intended to replace advice given to you by your health care provider. Make sure you discuss any questions you have with your health care provider.

## 2014-05-07 NOTE — Assessment & Plan Note (Signed)
family reports increasing behavioral problems and poor decision-making, social regarding finances Increasing forgetful behavior and repetitive conversation MMSE difficult due to expressive aphasia from prior neurologic insult trial low-dose Aricept 07/2013 at family preference - titrate as tolerated Did not tol namenda 10/2013 -  Titrate up Remeron for appetite and pseudodementia, symptoms thus far improved

## 2014-05-07 NOTE — Assessment & Plan Note (Signed)
Wt Readings from Last 3 Encounters:  05/07/14 122 lb (55.339 kg)  11/13/13 134 lb 12.8 oz (61.145 kg)  08/14/13 131 lb 6.4 oz (59.603 kg)  screening labs as above Titrate up Remeron as above Suspect age related - family aware of same" "given up"?

## 2014-05-07 NOTE — Assessment & Plan Note (Signed)
Tolerating statin well -  Reduced dose 04/2013 given FTT and check labs now -  Reviewed PVD and prior L ICA stent in 2002

## 2014-05-07 NOTE — Progress Notes (Signed)
Pre visit review using our clinic review tool, if applicable. No additional management support is needed unless otherwise documented below in the visit note. 

## 2014-05-07 NOTE — Assessment & Plan Note (Signed)
Patient with failure to thrive since compression fracture summer 2014 Improved appetite with Remeron (began 04/2013) and use of Boost supplementation suspect element of cognitive decline as well, ischemic and age related.  Check screening labs

## 2014-05-07 NOTE — Assessment & Plan Note (Signed)
  BP Readings from Last 3 Encounters:  05/07/14 136/62  11/13/13 130/62  08/14/13 112/70   The current medical regimen is effective;  continue present plan and medications.

## 2014-05-07 NOTE — Progress Notes (Signed)
Subjective:    Patient ID: James RedWilliam R Mcneil, male    DOB: 02/05/1923, 80 y.o.   MRN: [id number]  HPI  Here for medicare wellness  Diet: heart healthy Physical activity: sedentary Depression/mood screen: addressed Hearing: intact to whispered voice Visual acuity: grossly normal, performs annual eye exam  ADLs: capable Fall risk: reviewed Home safety: good Cognitive evaluation: intact to orientation, naming, recall and repetition EOL planning: adv directives, reviewed with pt/family  I have personally reviewed and have noted 1. The patient's medical and social history 2. Their use of alcohol, tobacco or illicit drugs 3. Their current medications and supplements 4. The patient's functional ability including ADL's, fall risks, home safety risks and hearing or visual impairment. 5. Diet and physical activities 6. Evidence for depression or mood disorders  Also reviewed chronic medical issues and interval medical events  Past Medical History  Diagnosis Date  . APHASIA, LE CEREBROVASCULAR DISEASE 2002    expressive  . HYPERGLYCEMIA   . HYPERLIPIDEMIA NEC/NOS   . HYPERTENSION   . Stroke 2002    residual R>LUE and mild exp aphasia  . COPD    History reviewed. No pertinent family history. History  Substance Use Topics  . Smoking status: Former Smoker    Types: Cigarettes  . Smokeless tobacco: Not on file  . Alcohol Use: No    Review of Systems  Constitutional: Positive for fatigue. Negative for fever, activity change, appetite change and unexpected weight change.  Respiratory: Negative for cough, chest tightness, shortness of breath and wheezing.   Cardiovascular: Negative for chest pain, palpitations and leg swelling.  Neurological: Negative for dizziness, weakness and headaches.  Psychiatric/Behavioral: Negative for dysphoric mood. The patient is not nervous/anxious.   All other systems reviewed and are negative.      Objective:   Physical Exam  BP 150/68   Pulse 71  Temp(Src) 97.6 F (36.4 C) (Oral)  Ht 5\' 3"  (1.6 m)  Wt 122 lb (55.339 kg)  BMI 21.62 kg/m2  SpO2 93% Wt Readings from Last 3 Encounters:  05/07/14 122 lb (55.339 kg)  11/13/13 134 lb 12.8 oz (61.145 kg)  08/14/13 131 lb 6.4 oz (59.603 kg)   Constitutional: he appears well-developed and well-nourished. No distress. Dtr at side Neck: Normal range of motion. Neck supple. No JVD present. No thyromegaly present.  Cardiovascular: Normal rate, regular rhythm and normal heart sounds.  No murmur heard. No BLE edema. Pulmonary/Chest: Effort normal and breath sounds normal. No respiratory distress. he has no wheezes.  Psychiatric: he has a normal mood and affect. His behavior is normal. Judgment and thought content normal.   Lab Results  Component Value Date   WBC 7.2 05/10/2013   HGB 13.2 05/10/2013   HCT 38.9* 05/10/2013   PLT 244.0 05/10/2013   GLUCOSE 108* 05/10/2013   CHOL 123 05/10/2013   TRIG 55.0 05/10/2013   HDL 59.90 05/10/2013   LDLCALC 52 05/10/2013   ALT 13 05/10/2013   AST 27 05/10/2013   NA 140 05/10/2013   K 5.2* 05/10/2013   CL 100 05/10/2013   CREATININE 0.9 05/10/2013   BUN 19 05/10/2013   CO2 31 05/10/2013   TSH 1.80 05/10/2013   HGBA1C 6.1 11/08/2012    Dg Chest 2 View  04/05/2013   CLINICAL DATA:  Cough, fever and shortness of Breath for 2 weeks.  EXAM: CHEST  2 VIEW  COMPARISON:  CT chest 08/2011 and PA and lateral chest 09/02/2011.  FINDINGS: The lungs are emphysematous. Basilar  scarring is again seen, worse on the left. No consolidative process, pneumothorax or effusion is identified. Heart size is normal. Since the prior study, the patient has suffered a severe compression fracture deformity at the thoracolumbar junction. Although age indeterminate, it appears remote. Remote superior endplate compression fracture in the upper lumbar spine is also noted and seen on the prior exam.  IMPRESSION: 1. No acute cardiopulmonary disease. 2. Emphysema. 3.  Compression fracture deformity lower thoracic spine cannot be definitively characterized but appears remote. It is new since the comparison examination.   Electronically Signed   By: Drusilla Kannerhomas  Dalessio M.D.   On: 04/05/2013 15:55       Assessment & Plan:   [id number] - Today patient counseled on age appropriate routine health concerns for screening and prevention, each reviewed and up to date or declined. Immunizations reviewed and up to date or declined. Labs ordered and reviewed. Risk factors for depression reviewed and negative. Hearing function and visual acuity are intact. ADLs screened and addressed as needed. Functional ability and level of safety reviewed and appropriate. Education, counseling and referrals performed based on assessed risks today. Patient provided with a copy of personalized plan for preventive services.  Problem List Items Addressed This Visit   Dementia     family reports increasing behavioral problems and poor decision-making, social regarding finances Increasing forgetful behavior and repetitive conversation MMSE difficult due to expressive aphasia from prior neurologic insult trial low-dose Aricept 07/2013 at family preference - titrate as tolerated Did not tol namenda 10/2013 -  Titrate up Remeron for appetite and pseudodementia, symptoms thus far improved    Relevant Medications      donepezil (ARICEPT) tablet      mirtazapine (REMERON) tablet   Dyslipidemia     Tolerating statin well -  Reduced dose 04/2013 given FTT and check labs now -  Reviewed PVD and prior L ICA stent in 2002    Relevant Medications      simvastatin (ZOCOR) tablet   Other Relevant Orders      Lipid panel   Essential hypertension       BP Readings from Last 3 Encounters:  05/07/14 136/62  11/13/13 130/62  08/14/13 112/70   The current medical regimen is effective;  continue present plan and medications.     Relevant Medications      metoprolol tartrate (LOPRESSOR) tablet       simvastatin (ZOCOR) tablet   Other Relevant Orders      Lipid panel      Basic metabolic panel   FTT (failure to thrive) in adult     Patient with failure to thrive since compression fracture summer 2014 Improved appetite with Remeron (began 04/2013) and use of Boost supplementation suspect element of cognitive decline as well, ischemic and age related.  Check screening labs    Relevant Orders      CBC with Differential      Hepatic function panel      TSH   Weight loss, unintentional      Wt Readings from Last 3 Encounters:  05/07/14 122 lb (55.339 kg)  11/13/13 134 lb 12.8 oz (61.145 kg)  08/14/13 131 lb 6.4 oz (59.603 kg)  screening labs as above Titrate up Remeron as above Suspect age related - family aware of same" "given up"?    Relevant Orders      CBC with Differential      Hepatic function panel      TSH    Other  Visit Diagnoses   Routine general medical examination at a health care facility    -  Primary    Need for prophylactic vaccination and inoculation against influenza        Relevant Orders       Flu Vaccine QUAD 36+ mos PF IM (Fluarix Quad PF) (Completed)

## 2014-08-15 ENCOUNTER — Emergency Department (HOSPITAL_COMMUNITY): Payer: Medicare Other

## 2014-08-15 ENCOUNTER — Inpatient Hospital Stay (HOSPITAL_COMMUNITY)
Admission: EM | Admit: 2014-08-15 | Discharge: 2014-08-21 | DRG: 086 | Disposition: A | Discharge status: Home Health | Payer: Medicare Other | Attending: Internal Medicine | Admitting: Internal Medicine

## 2014-08-15 ENCOUNTER — Encounter (HOSPITAL_COMMUNITY): Payer: Self-pay | Admitting: Emergency Medicine

## 2014-08-15 DIAGNOSIS — R0902 Hypoxemia: Secondary | ICD-10-CM | POA: Diagnosis present

## 2014-08-15 DIAGNOSIS — Z9181 History of falling: Secondary | ICD-10-CM | POA: Diagnosis not present

## 2014-08-15 DIAGNOSIS — D72829 Elevated white blood cell count, unspecified: Secondary | ICD-10-CM

## 2014-08-15 DIAGNOSIS — S060X9A Concussion with loss of consciousness of unspecified duration, initial encounter: Secondary | ICD-10-CM | POA: Diagnosis present

## 2014-08-15 DIAGNOSIS — W19XXXA Unspecified fall, initial encounter: Secondary | ICD-10-CM | POA: Diagnosis present

## 2014-08-15 DIAGNOSIS — R159 Full incontinence of feces: Secondary | ICD-10-CM | POA: Diagnosis not present

## 2014-08-15 DIAGNOSIS — J441 Chronic obstructive pulmonary disease with (acute) exacerbation: Secondary | ICD-10-CM | POA: Diagnosis present

## 2014-08-15 DIAGNOSIS — J8410 Pulmonary fibrosis, unspecified: Secondary | ICD-10-CM | POA: Diagnosis present

## 2014-08-15 DIAGNOSIS — S060X1A Concussion with loss of consciousness of 30 minutes or less, initial encounter: Secondary | ICD-10-CM

## 2014-08-15 DIAGNOSIS — R0602 Shortness of breath: Secondary | ICD-10-CM

## 2014-08-15 DIAGNOSIS — H3561 Retinal hemorrhage, right eye: Secondary | ICD-10-CM | POA: Diagnosis present

## 2014-08-15 DIAGNOSIS — Z87891 Personal history of nicotine dependence: Secondary | ICD-10-CM

## 2014-08-15 DIAGNOSIS — Z79899 Other long term (current) drug therapy: Secondary | ICD-10-CM

## 2014-08-15 DIAGNOSIS — I714 Abdominal aortic aneurysm, without rupture: Secondary | ICD-10-CM | POA: Diagnosis present

## 2014-08-15 DIAGNOSIS — I69351 Hemiplegia and hemiparesis following cerebral infarction affecting right dominant side: Secondary | ICD-10-CM | POA: Diagnosis not present

## 2014-08-15 DIAGNOSIS — E785 Hyperlipidemia, unspecified: Secondary | ICD-10-CM | POA: Diagnosis not present

## 2014-08-15 DIAGNOSIS — F0390 Unspecified dementia without behavioral disturbance: Secondary | ICD-10-CM | POA: Diagnosis not present

## 2014-08-15 DIAGNOSIS — I609 Nontraumatic subarachnoid hemorrhage, unspecified: Secondary | ICD-10-CM | POA: Diagnosis present

## 2014-08-15 DIAGNOSIS — I10 Essential (primary) hypertension: Secondary | ICD-10-CM | POA: Diagnosis not present

## 2014-08-15 DIAGNOSIS — R740 Nonspecific elevation of levels of transaminase and lactic acid dehydrogenase [LDH]: Secondary | ICD-10-CM | POA: Diagnosis not present

## 2014-08-15 DIAGNOSIS — S2249XA Multiple fractures of ribs, unspecified side, initial encounter for closed fracture: Secondary | ICD-10-CM | POA: Diagnosis present

## 2014-08-15 DIAGNOSIS — S066X1A Traumatic subarachnoid hemorrhage with loss of consciousness of 30 minutes or less, initial encounter: Principal | ICD-10-CM | POA: Diagnosis present

## 2014-08-15 DIAGNOSIS — I6990 Unspecified sequelae of unspecified cerebrovascular disease: Secondary | ICD-10-CM

## 2014-08-15 DIAGNOSIS — W1830XA Fall on same level, unspecified, initial encounter: Secondary | ICD-10-CM | POA: Diagnosis not present

## 2014-08-15 DIAGNOSIS — R7401 Elevation of levels of liver transaminase levels: Secondary | ICD-10-CM

## 2014-08-15 DIAGNOSIS — Z7982 Long term (current) use of aspirin: Secondary | ICD-10-CM

## 2014-08-15 DIAGNOSIS — Q644 Malformation of urachus: Secondary | ICD-10-CM

## 2014-08-15 DIAGNOSIS — S2242XA Multiple fractures of ribs, left side, initial encounter for closed fracture: Secondary | ICD-10-CM | POA: Diagnosis present

## 2014-08-15 DIAGNOSIS — G9349 Other encephalopathy: Secondary | ICD-10-CM

## 2014-08-15 DIAGNOSIS — T82858A Stenosis of vascular prosthetic devices, implants and grafts, initial encounter: Secondary | ICD-10-CM | POA: Diagnosis not present

## 2014-08-15 DIAGNOSIS — B999 Unspecified infectious disease: Secondary | ICD-10-CM | POA: Diagnosis present

## 2014-08-15 DIAGNOSIS — I6523 Occlusion and stenosis of bilateral carotid arteries: Secondary | ICD-10-CM | POA: Diagnosis present

## 2014-08-15 DIAGNOSIS — S42009A Fracture of unspecified part of unspecified clavicle, initial encounter for closed fracture: Secondary | ICD-10-CM | POA: Diagnosis present

## 2014-08-15 DIAGNOSIS — I639 Cerebral infarction, unspecified: Secondary | ICD-10-CM | POA: Diagnosis present

## 2014-08-15 DIAGNOSIS — S060XAA Concussion with loss of consciousness status unknown, initial encounter: Secondary | ICD-10-CM | POA: Diagnosis present

## 2014-08-15 LAB — COMPREHENSIVE METABOLIC PANEL
ALT: 41 U/L (ref 0–53)
AST: 73 U/L — ABNORMAL HIGH (ref 0–37)
Albumin: 3.4 g/dL — ABNORMAL LOW (ref 3.5–5.2)
Alkaline Phosphatase: 200 U/L — ABNORMAL HIGH (ref 39–117)
Anion gap: 9 (ref 5–15)
BILIRUBIN TOTAL: 0.8 mg/dL (ref 0.3–1.2)
BUN: 18 mg/dL (ref 6–23)
CHLORIDE: 101 mmol/L (ref 96–112)
CO2: 28 mmol/L (ref 19–32)
Calcium: 8.9 mg/dL (ref 8.4–10.5)
Creatinine, Ser: 1 mg/dL (ref 0.50–1.35)
GFR calc non Af Amer: 64 mL/min — ABNORMAL LOW (ref >90)
GFR, EST AFRICAN AMERICAN: 75 mL/min — AB (ref >90)
Glucose, Bld: 133 mg/dL — ABNORMAL HIGH (ref 70–99)
Potassium: 3.9 mmol/L (ref 3.5–5.1)
SODIUM: 138 mmol/L (ref 135–145)
Total Protein: 8.1 g/dL (ref 6.0–8.3)

## 2014-08-15 LAB — CBC WITH DIFFERENTIAL/PLATELET
Basophils Absolute: 0 10*3/uL (ref 0.0–0.1)
Basophils Relative: 0 % (ref 0–1)
Eosinophils Absolute: 0 10*3/uL (ref 0.0–0.7)
Eosinophils Relative: 0 % (ref 0–5)
HCT: 37.2 % — ABNORMAL LOW (ref 39.0–52.0)
Hemoglobin: 12.3 g/dL — ABNORMAL LOW (ref 13.0–17.0)
LYMPHS PCT: 5 % — AB (ref 12–46)
Lymphs Abs: 0.6 10*3/uL — ABNORMAL LOW (ref 0.7–4.0)
MCH: 30.8 pg (ref 26.0–34.0)
MCHC: 33.1 g/dL (ref 30.0–36.0)
MCV: 93.2 fL (ref 78.0–100.0)
Monocytes Absolute: 0.7 10*3/uL (ref 0.1–1.0)
Monocytes Relative: 6 % (ref 3–12)
Neutro Abs: 10.9 10*3/uL — ABNORMAL HIGH (ref 1.7–7.7)
Neutrophils Relative %: 89 % — ABNORMAL HIGH (ref 43–77)
Platelets: 374 10*3/uL (ref 150–400)
RBC: 3.99 MIL/uL — AB (ref 4.22–5.81)
RDW: 15.5 % (ref 11.5–15.5)
WBC: 12.3 10*3/uL — ABNORMAL HIGH (ref 4.0–10.5)

## 2014-08-15 LAB — URINALYSIS, ROUTINE W REFLEX MICROSCOPIC
BILIRUBIN URINE: NEGATIVE
GLUCOSE, UA: NEGATIVE mg/dL
KETONES UR: NEGATIVE mg/dL
Leukocytes, UA: NEGATIVE
Nitrite: NEGATIVE
PROTEIN: NEGATIVE mg/dL
SPECIFIC GRAVITY, URINE: 1.017 (ref 1.005–1.030)
Urobilinogen, UA: 0.2 mg/dL (ref 0.0–1.0)
pH: 6 (ref 5.0–8.0)

## 2014-08-15 LAB — URINE MICROSCOPIC-ADD ON

## 2014-08-15 MED ORDER — ACETAMINOPHEN 325 MG PO TABS
650.0000 mg | ORAL_TABLET | Freq: Once | ORAL | Status: AC
  Administered 2014-08-15: 650 mg via ORAL
  Filled 2014-08-15: qty 2

## 2014-08-15 MED ORDER — SODIUM CHLORIDE 0.9 % IV BOLUS (SEPSIS)
500.0000 mL | Freq: Once | INTRAVENOUS | Status: AC
  Administered 2014-08-15: 500 mL via INTRAVENOUS

## 2014-08-15 MED ORDER — TETANUS-DIPHTH-ACELL PERTUSSIS 5-2.5-18.5 LF-MCG/0.5 IM SUSP
0.5000 mL | Freq: Once | INTRAMUSCULAR | Status: AC
  Administered 2014-08-15: 0.5 mL via INTRAMUSCULAR
  Filled 2014-08-15: qty 0.5

## 2014-08-15 NOTE — ED Provider Notes (Signed)
CSN: [id number]     Arrival date & time 08/15/14  1753 History   First MD Initiated Contact with Patient 08/15/14 1836     Chief Complaint  Patient presents with  . Fall     (Consider location/radiation/quality/duration/timing/severity/associated sxs/prior Treatment) Patient is a 81 y.o. male presenting with fall. The history is provided by the EMS personnel (family).  Fall This is a new problem. The current episode started 1 to 2 hours ago. Episode frequency: once. The problem has been resolved. Pertinent negatives include no chest pain, no abdominal pain, no headaches and no shortness of breath. Nothing aggravates the symptoms. Nothing relieves the symptoms. He has tried nothing for the symptoms. The treatment provided no relief.    Past Medical History  Diagnosis Date  . APHASIA, LE CEREBROVASCULAR DISEASE 2002    expressive  . HYPERGLYCEMIA   . HYPERLIPIDEMIA NEC/NOS   . HYPERTENSION   . Stroke 2002    residual R>LUE and mild exp aphasia  . COPD    Past Surgical History  Procedure Laterality Date  . No past surgeries     No family history on file. History  Substance Use Topics  . Smoking status: Former Smoker    Types: Cigarettes  . Smokeless tobacco: Not on file  . Alcohol Use: No    Review of Systems  Constitutional: Negative for fever.  HENT: Negative for drooling and rhinorrhea.   Eyes: Negative for pain.  Respiratory: Negative for cough and shortness of breath.   Cardiovascular: Negative for chest pain and leg swelling.  Gastrointestinal: Negative for nausea, vomiting, abdominal pain and diarrhea.  Genitourinary: Negative for dysuria and hematuria.  Musculoskeletal: Negative for gait problem and neck pain.  Skin: Negative for color change.  Neurological: Negative for numbness and headaches.  Hematological: Negative for adenopathy.  Psychiatric/Behavioral: Negative for behavioral problems.  All other systems reviewed and are negative.     Allergies   Review of patient's allergies indicates no known allergies.  Home Medications   Prior to Admission medications   Medication Sig Start Date End Date Taking? Authorizing Provider  acetaminophen (TYLENOL) 325 MG tablet Take 2 tablets (650 mg total) by mouth 2 (two) times daily at 10 AM and 5 PM. 09/02/11 11/13/13  Newt Lukes, MD  aspirin 81 MG tablet Take 81 mg by mouth daily.      Historical Provider, MD  donepezil (ARICEPT) 10 MG tablet Take 1 tablet (10 mg total) by mouth at bedtime. 05/07/14   Newt Lukes, MD  furosemide (LASIX) 20 MG tablet TAKE ONE TABLET BY MOUTH ONCE DAILY 04/13/14   Newt Lukes, MD  metoprolol tartrate (LOPRESSOR) 25 MG tablet Take 1 tablet (25 mg total) by mouth 2 (two) times daily. 05/07/14   Newt Lukes, MD  mirtazapine (REMERON) 15 MG tablet Take 1 tablet (15 mg total) by mouth at bedtime. 05/07/14   Newt Lukes, MD  Omega-3 Fatty Acids (OMEGA-3 FISH OIL) 1200 MG CAPS Take 1,200 mg by mouth daily.      Historical Provider, MD  pantoprazole (PROTONIX) 40 MG tablet TAKE ONE TABLET BY MOUTH EVERY DAY 07/25/13   Newt Lukes, MD  simvastatin (ZOCOR) 20 MG tablet Take 1 tablet (20 mg total) by mouth at bedtime. 05/07/14   Newt Lukes, MD  tiZANidine (ZANAFLEX) 4 MG tablet Take 1 tablet (4 mg total) by mouth every 8 (eight) hours as needed (muscle spasm). 02/22/13   Newt Lukes, MD  traMADol (  ULTRAM) 50 MG tablet Take 1 tablet (50 mg total) by mouth every 8 (eight) hours as needed for pain. 02/22/13   Newt Lukes, MD   BP 149/67 mmHg  Pulse 87  Temp(Src) 98.5 F (36.9 C) (Oral)  Resp 18  SpO2 94% Physical Exam  Constitutional: He appears well-developed and well-nourished.  HENT:  Right Ear: External ear normal.  Left Ear: External ear normal.  Nose: Nose normal.  Mouth/Throat: Oropharynx is clear and moist. No oropharyngeal exudate.  Mild bruising and mild abrasions to the right temporal area.  Eyes:  Conjunctivae and EOM are normal. Pupils are equal, round, and reactive to light.  Neck: Normal range of motion. Neck supple.  No focal vertebral tenderness noted.  Cardiovascular: Normal rate, regular rhythm, normal heart sounds and intact distal pulses.  Exam reveals no gallop and no friction rub.   No murmur heard. Pulmonary/Chest: Effort normal and breath sounds normal. No respiratory distress. He has no wheezes.  Abdominal: Soft. Bowel sounds are normal. He exhibits no distension. There is no tenderness. There is no rebound and no guarding.  Musculoskeletal: Normal range of motion. He exhibits no edema or tenderness.  Neurological: He is alert.  A/o x1.   5/5 strength in LUE/LLE.  4/5 strength in RUE/RLE. Base line per family.  The patient has moderate pitting edema in the right lower extremity which is asymmetric from the left. The family notes this to be baseline as well.  Skin: Skin is warm and dry.  Moderate sized skin tear to bilateral elbows.  Psychiatric: He has a normal mood and affect. His behavior is normal.  Nursing note and vitals reviewed.   ED Course  Procedures (including critical care time) Labs Review Labs Reviewed  CBC WITH DIFFERENTIAL/PLATELET - Abnormal; Notable for the following:    WBC 12.3 (*)    RBC 3.99 (*)    Hemoglobin 12.3 (*)    HCT 37.2 (*)    Neutrophils Relative % 89 (*)    Neutro Abs 10.9 (*)    Lymphocytes Relative 5 (*)    Lymphs Abs 0.6 (*)    All other components within normal limits  COMPREHENSIVE METABOLIC PANEL - Abnormal; Notable for the following:    Glucose, Bld 133 (*)    Albumin 3.4 (*)    AST 73 (*)    Alkaline Phosphatase 200 (*)    GFR calc non Af Amer 64 (*)    GFR calc Af Amer 75 (*)    All other components within normal limits  URINALYSIS, ROUTINE W REFLEX MICROSCOPIC - Abnormal; Notable for the following:    Hgb urine dipstick SMALL (*)    All other components within normal limits  URINE MICROSCOPIC-ADD ON  TSH   HEMOGLOBIN A1C  TROPONIN I  TROPONIN I  TROPONIN I  COMPREHENSIVE METABOLIC PANEL  CBC    Imaging Review Dg Chest 2 View  08/15/2014   CLINICAL DATA:  Status post fall at home. Hit head. Right-sided weakness.  EXAM: CHEST  2 VIEW  COMPARISON:  09/02/2011, 04/05/2013  FINDINGS: There is right upper lobe and left basilar chronic interstitial disease. There is right basilar airspace disease which may reflect atelectasis versus scarring.  No focal consolidation, pleural effusion or pneumothorax.  Stable cardiomediastinal silhouette.  Possible left anterior lateral seventh and eighth rib fractures.  IMPRESSION: No active cardiopulmonary disease.  Possible left anterior lateral seventh and eighth rib fractures.   Electronically Signed   By: Elige Ko  On: 08/15/2014 20:31   Dg Pelvis 1-2 Views  08/15/2014   CLINICAL DATA:  Status post fall  EXAM: PELVIS - 1-2 VIEW  COMPARISON:  None.  FINDINGS: Generalized osteopenia. No fracture or dislocation. Mild degenerative changes of bilateral SI joints. Peripheral vascular atherosclerotic disease.  IMPRESSION: No acute osseous injury of the pelvis.   Electronically Signed   By: Elige Ko   On: 08/15/2014 20:32   Ct Head Wo Contrast  08/15/2014   CLINICAL DATA:  Status post fall but did not lose consciousness after falling.  EXAM: CT HEAD WITHOUT CONTRAST  CT CERVICAL SPINE WITHOUT CONTRAST  TECHNIQUE: Multidetector CT imaging of the head and cervical spine was performed following the standard protocol without intravenous contrast. Multiplanar CT image reconstructions of the cervical spine were also generated.  COMPARISON:  None.  FINDINGS: CT HEAD FINDINGS  There is no evidence of mass effect, midline shift, or extra-axial fluid collections. There is no evidence of a space-occupying lesion or intracranial hemorrhage. There is no evidence of a cortical-based area of acute infarction. There is a large left MCA territory infarct with encephalomalacia. There  is generalized cerebral atrophy. There is periventricular white matter low attenuation likely secondary to microangiopathy.  The ventricles and sulci are appropriate for the patient's age. The basal cisterns are patent.  Visualized portions of the orbits are unremarkable. The visualized portions of the paranasal sinuses and mastoid air cells are unremarkable. Cerebrovascular atherosclerotic calcifications are noted.  The osseous structures are unremarkable.  CT CERVICAL SPINE FINDINGS  The alignment is anatomic. The vertebral body heights are maintained. There is no acute fracture. There is no static listhesis. The prevertebral soft tissues are normal. The intraspinal soft tissues are not fully imaged on this examination due to poor soft tissue contrast, but there is no gross soft tissue abnormality.  There is degenerative disc disease at C3-4, C5-6 and C6-7. There is osseous fusion of the right posterior elements at C3-4. There is severe right and moderate left facet arthropathy at C2-3 with bilateral foraminal narrowing. There is bilateral facet arthropathy, right worse than left, at C3-4 with bilateral uncovertebral degenerative changes resulting in severe bilateral foraminal encroachment. Bilateral facet arthropathy at C4-5. Bilateral facet arthropathy at C5-6 with a broad-based disc osteophyte complex and bilateral uncovertebral degenerative changes resulting in foraminal encroachment.  There is right carotid artery atherosclerosis. There is a left carotid artery stent. There is a 10 mm spiculated subpleural right upper lobe pulmonary nodule which may reflect an area of scarring. There are right apical blebs.  IMPRESSION: 1. No acute intracranial pathology. 2. No acute osseous injury of the cervical spine. 3. 10 mm spiculated subpleural right upper lobe pulmonary nodule which is more prominent compared with CT chest dated 09/09/2011. A dedicated non emergent CT of the chest is recommended for further  evaluation. This may reflect scarring versus malignancy.   Electronically Signed   By: Elige Ko   On: 08/15/2014 20:15   Ct Cervical Spine Wo Contrast  08/15/2014   CLINICAL DATA:  Status post fall but did not lose consciousness after falling.  EXAM: CT HEAD WITHOUT CONTRAST  CT CERVICAL SPINE WITHOUT CONTRAST  TECHNIQUE: Multidetector CT imaging of the head and cervical spine was performed following the standard protocol without intravenous contrast. Multiplanar CT image reconstructions of the cervical spine were also generated.  COMPARISON:  None.  FINDINGS: CT HEAD FINDINGS  There is no evidence of mass effect, midline shift, or extra-axial fluid collections. There is  no evidence of a space-occupying lesion or intracranial hemorrhage. There is no evidence of a cortical-based area of acute infarction. There is a large left MCA territory infarct with encephalomalacia. There is generalized cerebral atrophy. There is periventricular white matter low attenuation likely secondary to microangiopathy.  The ventricles and sulci are appropriate for the patient's age. The basal cisterns are patent.  Visualized portions of the orbits are unremarkable. The visualized portions of the paranasal sinuses and mastoid air cells are unremarkable. Cerebrovascular atherosclerotic calcifications are noted.  The osseous structures are unremarkable.  CT CERVICAL SPINE FINDINGS  The alignment is anatomic. The vertebral body heights are maintained. There is no acute fracture. There is no static listhesis. The prevertebral soft tissues are normal. The intraspinal soft tissues are not fully imaged on this examination due to poor soft tissue contrast, but there is no gross soft tissue abnormality.  There is degenerative disc disease at C3-4, C5-6 and C6-7. There is osseous fusion of the right posterior elements at C3-4. There is severe right and moderate left facet arthropathy at C2-3 with bilateral foraminal narrowing. There is  bilateral facet arthropathy, right worse than left, at C3-4 with bilateral uncovertebral degenerative changes resulting in severe bilateral foraminal encroachment. Bilateral facet arthropathy at C4-5. Bilateral facet arthropathy at C5-6 with a broad-based disc osteophyte complex and bilateral uncovertebral degenerative changes resulting in foraminal encroachment.  There is right carotid artery atherosclerosis. There is a left carotid artery stent. There is a 10 mm spiculated subpleural right upper lobe pulmonary nodule which may reflect an area of scarring. There are right apical blebs.  IMPRESSION: 1. No acute intracranial pathology. 2. No acute osseous injury of the cervical spine. 3. 10 mm spiculated subpleural right upper lobe pulmonary nodule which is more prominent compared with CT chest dated 09/09/2011. A dedicated non emergent CT of the chest is recommended for further evaluation. This may reflect scarring versus malignancy.   Electronically Signed   By: Elige KoHetal  Patel   On: 08/15/2014 20:15     EKG Interpretation   Date/Time:  Wednesday August 15 2014 19:28:32 EST Ventricular Rate:  91 PR Interval:  191 QRS Duration: 85 QT Interval:  355 QTC Calculation: 437 R Axis:   -27 Text Interpretation:  Sinus rhythm Borderline left axis deviation Anterior  infarct, old No significant change since last tracing Confirmed by  Starla Deller  MD, Tyrel Lex ([id number]) on 08/15/2014 7:34:56 PM      MDM   Final diagnoses:  Fall  Concussion, with loss of consciousness of 30 minutes or less, initial encounter  Leukocytosis  Hypoxia    7:05 PM 81 y.o. male w hx of CVA (right sided deficits), copd, dementia who presents with a mechanical fall which occurred several hours ago. The patient felt the sudden onset to have a bowel movement and slipped on his way to the bathroom hitting the right temporal area of his head on the threshold of the door. The family member noted that he did appear to have loss of  consciousness. He has no complaints currently on exam. He appears to be at baseline per the family. He is afebrile and vital signs are unremarkable here. We'll get screening labs and imaging. Will update Tdap. He refuses any pain medicine.   Will admit to hospitalist.   Purvis SheffieldForrest Berkleigh Beckles, MD 08/16/14 0110

## 2014-08-15 NOTE — ED Notes (Signed)
Pt ambulate with assistance only, with unsteady gait, family member states they don't feel safe to take care of the pt at home they will like him to stay in the hospital.

## 2014-08-15 NOTE — ED Notes (Signed)
Patient transported to CT 

## 2014-08-15 NOTE — ED Notes (Signed)
[facility]. Pt fell hitting head and has skin tear to bilateral arms. Pt did loose consciousness after falling and defecated on himself. Hx of stroke in 2001 with right sided weakness. CBG-161 BP-140/80 HR-94 Resp-18 Denies any pain.

## 2014-08-15 NOTE — H&P (Signed)
[facility] History and Physical  James Mcneil NWG:[id number] DOB: June 15, 1923 DOA: 08/15/2014  Referring physician: Purvis Sheffield, MD PCP: Rene Paci, MD   Chief Complaint: Fall and LOC  HPI: James Mcneil is a 81 y.o. male presents after a fall. Patient has had issues with losing control of his bowels and urine. Today patient was on his way to the bathroom and had lost ontrol of his bowels. Apprently he slind on the stool and fell down hitting his head on the floor. He lost consciousness at that time for about 5-10 minutes per his family. He has had falls previously last year. Patient had a stroke 2001 and his status has been declining since that time. He does have difficulty expressing himself since his stroke and today the family states he was alittle worse. At the time of evaliuation he is awake but has difficulty expressing   Review of Systems:  Patient not able to provide ROS  Past Medical History  Diagnosis Date  . APHASIA, LE CEREBROVASCULAR DISEASE 2002    expressive  . HYPERGLYCEMIA   . HYPERLIPIDEMIA NEC/NOS   . HYPERTENSION   . Stroke 2002    residual R>LUE and mild exp aphasia  . COPD    Past Surgical History  Procedure Laterality Date  . No past surgeries     Social History:  reports that he has quit smoking. His smoking use included Cigarettes. He does not have any smokeless tobacco history on file. He reports that he does not drink alcohol or use illicit drugs.  No Known Allergies  No family history on file.   Prior to Admission medications   Medication Sig Start Date End Date Taking? Authorizing Provider  acetaminophen (TYLENOL) 325 MG tablet Take 2 tablets (650 mg total) by mouth 2 (two) times daily at 10 AM and 5 PM. 09/02/11 11/13/13  Newt Lukes, MD  aspirin 81 MG tablet Take 81 mg by mouth daily.      Historical Provider, MD  donepezil (ARICEPT) 10 MG tablet Take 1 tablet (10 mg total) by mouth at bedtime. 05/07/14   Newt Lukes, MD  furosemide (LASIX) 20 MG tablet TAKE ONE TABLET BY MOUTH ONCE DAILY 04/13/14   Newt Lukes, MD  metoprolol tartrate (LOPRESSOR) 25 MG tablet Take 1 tablet (25 mg total) by mouth 2 (two) times daily. 05/07/14   Newt Lukes, MD  mirtazapine (REMERON) 15 MG tablet Take 1 tablet (15 mg total) by mouth at bedtime. 05/07/14   Newt Lukes, MD  Omega-3 Fatty Acids (OMEGA-3 FISH OIL) 1200 MG CAPS Take 1,200 mg by mouth daily.      Historical Provider, MD  pantoprazole (PROTONIX) 40 MG tablet TAKE ONE TABLET BY MOUTH EVERY DAY 07/25/13   Newt Lukes, MD  simvastatin (ZOCOR) 20 MG tablet Take 1 tablet (20 mg total) by mouth at bedtime. 05/07/14   Newt Lukes, MD  tiZANidine (ZANAFLEX) 4 MG tablet Take 1 tablet (4 mg total) by mouth every 8 (eight) hours as needed (muscle spasm). 02/22/13   Newt Lukes, MD  traMADol (ULTRAM) 50 MG tablet Take 1 tablet (50 mg total) by mouth every 8 (eight) hours as needed for pain. 02/22/13   Newt Lukes, MD   Physical Exam: Filed Vitals:   08/15/14 2130 08/15/14 2145 08/15/14 2200 08/15/14 2215  BP: 143/64 155/57 131/53 123/48  Pulse: 114 103 94 95  Temp:      TempSrc:  Resp: 15 15 11 16   SpO2: 98% 99% 99% 98%    Wt Readings from Last 3 Encounters:  05/07/14 55.339 kg (122 lb)  11/13/13 61.145 kg (134 lb 12.8 oz)  08/14/13 59.603 kg (131 lb 6.4 oz)    General:  Appears calm and comfortable Eyes: PERRL, normal lids, irises & conjunctiva ENT: grossly normal hearing, lips & tongue Neck: no LAD, masses or thyromegaly Cardiovascular: RRR, no m/r/g. No LE edema. Respiratory: CTA bilaterally, no w/r/r. Normal respiratory effort. Abdomen: soft, ntnd Skin: no rash or induration seen on limited exam Musculoskeletal: grossly normal tone BUE/BLE Psychiatric: speech not clear but comprehends Neurologic: not able to follow command at this time. Seems to have issues since his stroke in the past           Labs on Admission:  Basic Metabolic Panel:  Recent Labs Lab 08/15/14 1905  NA 138  K 3.9  CL 101  CO2 28  GLUCOSE 133*  BUN 18  CREATININE 1.00  CALCIUM 8.9   Liver Function Tests:  Recent Labs Lab 08/15/14 1905  AST 73*  ALT 41  ALKPHOS 200*  BILITOT 0.8  PROT 8.1  ALBUMIN 3.4*   No results for input(s): LIPASE, AMYLASE in the last 168 hours. No results for input(s): AMMONIA in the last 168 hours. CBC:  Recent Labs Lab 08/15/14 1905  WBC 12.3*  NEUTROABS 10.9*  HGB 12.3*  HCT 37.2*  MCV 93.2  PLT 374   Cardiac Enzymes: No results for input(s): CKTOTAL, CKMB, CKMBINDEX, TROPONINI in the last 168 hours.  BNP (last 3 results) No results for input(s): PROBNP in the last 8760 hours. CBG: No results for input(s): GLUCAP in the last 168 hours.  Radiological Exams on Admission: Dg Chest 2 View  08/15/2014   CLINICAL DATA:  Status post fall at home. Hit head. Right-sided weakness.  EXAM: CHEST  2 VIEW  COMPARISON:  09/02/2011, 04/05/2013  FINDINGS: There is right upper lobe and left basilar chronic interstitial disease. There is right basilar airspace disease which may reflect atelectasis versus scarring.  No focal consolidation, pleural effusion or pneumothorax.  Stable cardiomediastinal silhouette.  Possible left anterior lateral seventh and eighth rib fractures.  IMPRESSION: No active cardiopulmonary disease.  Possible left anterior lateral seventh and eighth rib fractures.   Electronically Signed   By: Elige Ko   On: 08/15/2014 20:31   Dg Pelvis 1-2 Views  08/15/2014   CLINICAL DATA:  Status post fall  EXAM: PELVIS - 1-2 VIEW  COMPARISON:  None.  FINDINGS: Generalized osteopenia. No fracture or dislocation. Mild degenerative changes of bilateral SI joints. Peripheral vascular atherosclerotic disease.  IMPRESSION: No acute osseous injury of the pelvis.   Electronically Signed   By: Elige Ko   On: 08/15/2014 20:32   Ct Head Wo Contrast  08/15/2014    CLINICAL DATA:  Status post fall but did not lose consciousness after falling.  EXAM: CT HEAD WITHOUT CONTRAST  CT CERVICAL SPINE WITHOUT CONTRAST  TECHNIQUE: Multidetector CT imaging of the head and cervical spine was performed following the standard protocol without intravenous contrast. Multiplanar CT image reconstructions of the cervical spine were also generated.  COMPARISON:  None.  FINDINGS: CT HEAD FINDINGS  There is no evidence of mass effect, midline shift, or extra-axial fluid collections. There is no evidence of a space-occupying lesion or intracranial hemorrhage. There is no evidence of a cortical-based area of acute infarction. There is a large left MCA territory infarct with encephalomalacia.  There is generalized cerebral atrophy. There is periventricular white matter low attenuation likely secondary to microangiopathy.  The ventricles and sulci are appropriate for the patient's age. The basal cisterns are patent.  Visualized portions of the orbits are unremarkable. The visualized portions of the paranasal sinuses and mastoid air cells are unremarkable. Cerebrovascular atherosclerotic calcifications are noted.  The osseous structures are unremarkable.  CT CERVICAL SPINE FINDINGS  The alignment is anatomic. The vertebral body heights are maintained. There is no acute fracture. There is no static listhesis. The prevertebral soft tissues are normal. The intraspinal soft tissues are not fully imaged on this examination due to poor soft tissue contrast, but there is no gross soft tissue abnormality.  There is degenerative disc disease at C3-4, C5-6 and C6-7. There is osseous fusion of the right posterior elements at C3-4. There is severe right and moderate left facet arthropathy at C2-3 with bilateral foraminal narrowing. There is bilateral facet arthropathy, right worse than left, at C3-4 with bilateral uncovertebral degenerative changes resulting in severe bilateral foraminal encroachment. Bilateral  facet arthropathy at C4-5. Bilateral facet arthropathy at C5-6 with a broad-based disc osteophyte complex and bilateral uncovertebral degenerative changes resulting in foraminal encroachment.  There is right carotid artery atherosclerosis. There is a left carotid artery stent. There is a 10 mm spiculated subpleural right upper lobe pulmonary nodule which may reflect an area of scarring. There are right apical blebs.  IMPRESSION: 1. No acute intracranial pathology. 2. No acute osseous injury of the cervical spine. 3. 10 mm spiculated subpleural right upper lobe pulmonary nodule which is more prominent compared with CT chest dated 09/09/2011. A dedicated non emergent CT of the chest is recommended for further evaluation. This may reflect scarring versus malignancy.   Electronically Signed   By: Elige Ko   On: 08/15/2014 20:15   Ct Cervical Spine Wo Contrast  08/15/2014   CLINICAL DATA:  Status post fall but did not lose consciousness after falling.  EXAM: CT HEAD WITHOUT CONTRAST  CT CERVICAL SPINE WITHOUT CONTRAST  TECHNIQUE: Multidetector CT imaging of the head and cervical spine was performed following the standard protocol without intravenous contrast. Multiplanar CT image reconstructions of the cervical spine were also generated.  COMPARISON:  None.  FINDINGS: CT HEAD FINDINGS  There is no evidence of mass effect, midline shift, or extra-axial fluid collections. There is no evidence of a space-occupying lesion or intracranial hemorrhage. There is no evidence of a cortical-based area of acute infarction. There is a large left MCA territory infarct with encephalomalacia. There is generalized cerebral atrophy. There is periventricular white matter low attenuation likely secondary to microangiopathy.  The ventricles and sulci are appropriate for the patient's age. The basal cisterns are patent.  Visualized portions of the orbits are unremarkable. The visualized portions of the paranasal sinuses and mastoid air  cells are unremarkable. Cerebrovascular atherosclerotic calcifications are noted.  The osseous structures are unremarkable.  CT CERVICAL SPINE FINDINGS  The alignment is anatomic. The vertebral body heights are maintained. There is no acute fracture. There is no static listhesis. The prevertebral soft tissues are normal. The intraspinal soft tissues are not fully imaged on this examination due to poor soft tissue contrast, but there is no gross soft tissue abnormality.  There is degenerative disc disease at C3-4, C5-6 and C6-7. There is osseous fusion of the right posterior elements at C3-4. There is severe right and moderate left facet arthropathy at C2-3 with bilateral foraminal narrowing. There is bilateral facet arthropathy,  right worse than left, at C3-4 with bilateral uncovertebral degenerative changes resulting in severe bilateral foraminal encroachment. Bilateral facet arthropathy at C4-5. Bilateral facet arthropathy at C5-6 with a broad-based disc osteophyte complex and bilateral uncovertebral degenerative changes resulting in foraminal encroachment.  There is right carotid artery atherosclerosis. There is a left carotid artery stent. There is a 10 mm spiculated subpleural right upper lobe pulmonary nodule which may reflect an area of scarring. There are right apical blebs.  IMPRESSION: 1. No acute intracranial pathology. 2. No acute osseous injury of the cervical spine. 3. 10 mm spiculated subpleural right upper lobe pulmonary nodule which is more prominent compared with CT chest dated 09/09/2011. A dedicated non emergent CT of the chest is recommended for further evaluation. This may reflect scarring versus malignancy.   Electronically Signed   By: Elige KoHetal  Patel   On: 08/15/2014 20:15     Assessment/Plan Active Problems:   Dementia   Fall   LOC (loss of consciousness)   Multiple rib fractures   1. Fall -mechanical fall has had full workup in the ED -patient need further assessment and  possible skilled facility placement  2. LOC -suspect a concussion episode -will admit for observation -neuro checks -CT scan done  3. Rib Fractures -pain control -patient is refusing pain meds presently  4. Skin Abrasions -secondary to fall -will continue supportive care  Code Status: Heparin (must indicate code status--if unknown or must be presumed, indicate so) DVT Prophylaxis:SCD Family Communication: Daughter (indicate person spoken with, if applicable, with phone number if by telephone) Disposition Plan: Home (indicate anticipated LOS)  Time spent: 60min  [facility],SAADAT A [facility] Pager [phone]

## 2014-08-16 ENCOUNTER — Observation Stay (HOSPITAL_COMMUNITY): Payer: Medicare Other

## 2014-08-16 DIAGNOSIS — J441 Chronic obstructive pulmonary disease with (acute) exacerbation: Secondary | ICD-10-CM | POA: Diagnosis present

## 2014-08-16 DIAGNOSIS — B999 Unspecified infectious disease: Secondary | ICD-10-CM

## 2014-08-16 DIAGNOSIS — G9349 Other encephalopathy: Secondary | ICD-10-CM

## 2014-08-16 LAB — COMPREHENSIVE METABOLIC PANEL
ALBUMIN: 2.7 g/dL — AB (ref 3.5–5.2)
ALT: 29 U/L (ref 0–53)
AST: 45 U/L — ABNORMAL HIGH (ref 0–37)
Alkaline Phosphatase: 160 U/L — ABNORMAL HIGH (ref 39–117)
Anion gap: 10 (ref 5–15)
BILIRUBIN TOTAL: 1.1 mg/dL (ref 0.3–1.2)
BUN: 13 mg/dL (ref 6–23)
CO2: 25 mmol/L (ref 19–32)
Calcium: 8.6 mg/dL (ref 8.4–10.5)
Chloride: 105 mmol/L (ref 96–112)
Creatinine, Ser: 0.83 mg/dL (ref 0.50–1.35)
GFR calc Af Amer: 88 mL/min — ABNORMAL LOW (ref >90)
GFR, EST NON AFRICAN AMERICAN: 76 mL/min — AB (ref >90)
GLUCOSE: 129 mg/dL — AB (ref 70–99)
POTASSIUM: 4 mmol/L (ref 3.5–5.1)
Sodium: 140 mmol/L (ref 135–145)
TOTAL PROTEIN: 6.5 g/dL (ref 6.0–8.3)

## 2014-08-16 LAB — CBC
HCT: 35.5 % — ABNORMAL LOW (ref 39.0–52.0)
Hemoglobin: 11.5 g/dL — ABNORMAL LOW (ref 13.0–17.0)
MCH: 30.6 pg (ref 26.0–34.0)
MCHC: 32.4 g/dL (ref 30.0–36.0)
MCV: 94.4 fL (ref 78.0–100.0)
PLATELETS: 331 10*3/uL (ref 150–400)
RBC: 3.76 MIL/uL — ABNORMAL LOW (ref 4.22–5.81)
RDW: 15.9 % — ABNORMAL HIGH (ref 11.5–15.5)
WBC: 6.9 10*3/uL (ref 4.0–10.5)

## 2014-08-16 LAB — TROPONIN I
TROPONIN I: 0.04 ng/mL — AB (ref <0.031)
TROPONIN I: 0.03 ng/mL (ref <0.031)
Troponin I: 0.03 ng/mL (ref <0.031)

## 2014-08-16 LAB — VITAMIN B12: VITAMIN B 12: 575 pg/mL (ref 211–911)

## 2014-08-16 LAB — TSH: TSH: 1.445 u[IU]/mL (ref 0.350–4.500)

## 2014-08-16 MED ORDER — PANTOPRAZOLE SODIUM 40 MG PO TBEC
40.0000 mg | DELAYED_RELEASE_TABLET | Freq: Every day | ORAL | Status: DC
  Administered 2014-08-16 – 2014-08-21 (×6): 40 mg via ORAL
  Filled 2014-08-16 (×6): qty 1

## 2014-08-16 MED ORDER — DEXTROSE 5 % IV SOLN
500.0000 mg | INTRAVENOUS | Status: DC
  Administered 2014-08-16: 500 mg via INTRAVENOUS
  Filled 2014-08-16 (×3): qty 500

## 2014-08-16 MED ORDER — IPRATROPIUM-ALBUTEROL 0.5-2.5 (3) MG/3ML IN SOLN
3.0000 mL | RESPIRATORY_TRACT | Status: DC
  Administered 2014-08-16 (×2): 3 mL via RESPIRATORY_TRACT
  Filled 2014-08-16 (×2): qty 3

## 2014-08-16 MED ORDER — ACETAMINOPHEN 650 MG RE SUPP
650.0000 mg | Freq: Four times a day (QID) | RECTAL | Status: DC | PRN
Start: 2014-08-16 — End: 2014-08-21

## 2014-08-16 MED ORDER — ACETAMINOPHEN 325 MG PO TABS
650.0000 mg | ORAL_TABLET | Freq: Four times a day (QID) | ORAL | Status: DC | PRN
Start: 2014-08-16 — End: 2014-08-21
  Administered 2014-08-16 – 2014-08-20 (×2): 650 mg via ORAL
  Filled 2014-08-16 (×2): qty 2

## 2014-08-16 MED ORDER — MIRTAZAPINE 15 MG PO TABS
15.0000 mg | ORAL_TABLET | Freq: Every day | ORAL | Status: DC
  Administered 2014-08-16 – 2014-08-20 (×6): 15 mg via ORAL
  Filled 2014-08-16 (×7): qty 1

## 2014-08-16 MED ORDER — ACETAMINOPHEN 325 MG PO TABS
650.0000 mg | ORAL_TABLET | Freq: Two times a day (BID) | ORAL | Status: DC
  Administered 2014-08-16 – 2014-08-19 (×7): 650 mg via ORAL
  Filled 2014-08-16 (×7): qty 2

## 2014-08-16 MED ORDER — SODIUM CHLORIDE 0.9 % IV SOLN
INTRAVENOUS | Status: DC
  Administered 2014-08-16: 1000 mL via INTRAVENOUS

## 2014-08-16 MED ORDER — ONDANSETRON HCL 4 MG PO TABS
4.0000 mg | ORAL_TABLET | Freq: Four times a day (QID) | ORAL | Status: DC | PRN
Start: 2014-08-16 — End: 2014-08-21

## 2014-08-16 MED ORDER — SIMVASTATIN 20 MG PO TABS
20.0000 mg | ORAL_TABLET | Freq: Every day | ORAL | Status: DC
  Administered 2014-08-16 (×2): 20 mg via ORAL
  Filled 2014-08-16 (×2): qty 1

## 2014-08-16 MED ORDER — ADULT MULTIVITAMIN W/MINERALS CH
1.0000 | ORAL_TABLET | Freq: Every day | ORAL | Status: DC
  Administered 2014-08-16 – 2014-08-21 (×6): 1 via ORAL
  Filled 2014-08-16 (×6): qty 1

## 2014-08-16 MED ORDER — ASPIRIN EC 81 MG PO TBEC
81.0000 mg | DELAYED_RELEASE_TABLET | Freq: Every day | ORAL | Status: DC
  Administered 2014-08-16 – 2014-08-21 (×6): 81 mg via ORAL
  Filled 2014-08-16 (×6): qty 1

## 2014-08-16 MED ORDER — PREDNISONE 20 MG PO TABS
40.0000 mg | ORAL_TABLET | Freq: Once | ORAL | Status: AC
  Administered 2014-08-16: 40 mg via ORAL
  Filled 2014-08-16: qty 2

## 2014-08-16 MED ORDER — PREDNISONE 5 MG PO TABS
30.0000 mg | ORAL_TABLET | Freq: Every day | ORAL | Status: DC
  Administered 2014-08-17 – 2014-08-19 (×3): 30 mg via ORAL
  Filled 2014-08-16: qty 1
  Filled 2014-08-16 (×2): qty 2
  Filled 2014-08-16: qty 1
  Filled 2014-08-16: qty 2
  Filled 2014-08-16: qty 1

## 2014-08-16 MED ORDER — DONEPEZIL HCL 10 MG PO TABS
10.0000 mg | ORAL_TABLET | Freq: Every day | ORAL | Status: DC
  Administered 2014-08-16 – 2014-08-20 (×6): 10 mg via ORAL
  Filled 2014-08-16 (×6): qty 1

## 2014-08-16 MED ORDER — FOLIC ACID 1 MG PO TABS
1.0000 mg | ORAL_TABLET | Freq: Every day | ORAL | Status: DC
  Administered 2014-08-16 – 2014-08-21 (×6): 1 mg via ORAL
  Filled 2014-08-16 (×6): qty 1

## 2014-08-16 MED ORDER — TIZANIDINE HCL 4 MG PO TABS
4.0000 mg | ORAL_TABLET | Freq: Three times a day (TID) | ORAL | Status: DC | PRN
Start: 2014-08-16 — End: 2014-08-21
  Administered 2014-08-16: 4 mg via ORAL
  Filled 2014-08-16 (×3): qty 1

## 2014-08-16 MED ORDER — FUROSEMIDE 20 MG PO TABS
20.0000 mg | ORAL_TABLET | Freq: Once | ORAL | Status: AC
  Administered 2014-08-16: 20 mg via ORAL
  Filled 2014-08-16: qty 1

## 2014-08-16 MED ORDER — ONDANSETRON HCL 4 MG/2ML IJ SOLN: 4.0000 mg | Freq: Four times a day (QID) | INTRAMUSCULAR | Status: DC | PRN

## 2014-08-16 MED ORDER — METOPROLOL TARTRATE 25 MG PO TABS
25.0000 mg | ORAL_TABLET | Freq: Two times a day (BID) | ORAL | Status: DC
Start: 2014-08-16 — End: 2014-08-21
  Administered 2014-08-16 – 2014-08-21 (×10): 25 mg via ORAL
  Filled 2014-08-16 (×11): qty 1

## 2014-08-16 MED ORDER — CEFTRIAXONE SODIUM IN DEXTROSE 20 MG/ML IV SOLN
1.0000 g | INTRAVENOUS | Status: AC
  Administered 2014-08-16 – 2014-08-19 (×4): 1 g via INTRAVENOUS
  Filled 2014-08-16 (×5): qty 50

## 2014-08-16 MED ORDER — VITAMIN B-1 100 MG PO TABS
100.0000 mg | ORAL_TABLET | Freq: Every day | ORAL | Status: DC
  Administered 2014-08-16 – 2014-08-21 (×6): 100 mg via ORAL
  Filled 2014-08-16 (×7): qty 1

## 2014-08-16 MED ORDER — FUROSEMIDE 20 MG PO TABS
20.0000 mg | ORAL_TABLET | Freq: Every day | ORAL | Status: DC
  Administered 2014-08-16 – 2014-08-21 (×6): 20 mg via ORAL
  Filled 2014-08-16 (×6): qty 1

## 2014-08-16 NOTE — Progress Notes (Signed)
UR completed 

## 2014-08-16 NOTE — Progress Notes (Signed)
Patient arrived around 0000, daughter and grand daughters were with, patient's vital signs are stable complaining of moderate chest pain IV infusion, SCD's started and given muscle relaxer to relieve muscle spasms. Will continue to monitor.

## 2014-08-16 NOTE — Progress Notes (Signed)
[facility] PROGRESS NOTE  [name]:[id number] DOB: Aug 02, 1922 DOA: 08/15/2014 PCP: Rene Paci, MD  Summary I have seen and examined Mr. James Mcneil in the presence of family members and reviewed his chart. James Mcneil is a pleasant 81 y.o. male with COPD/dementia/essential hypertension/hyperlipidemia, among other medical problems who presented after a fall associated with bladder and bowel incontinence as well as altered sensorium. His workup in the ED was unremarkable but he now has low-grade fever and respiratory symptoms suggesting acute exacerbation of COPD(white count 12,000 yesterday), which may have caused the fall. There was concern for broken ribs. He has a wet cough. Will therefore repeat chest x-ray(post hydration), and antibiotics/systemic steroids/bronchodilators and oxygen supplementation as needed. Will also add incentive spirometer, Consult physical therapy and Check vitamin B12 level.  Plan Fall/Encephalopathy due to infection/Late effects of cerebrovascular disease/Dementia  Check vitamin B12 level  Consult physical therapy COPD exacerbation/Multiple rib fractures  Repeat chest x-ray  Ceftriaxone/azithromycin/prednisone/albuterol/Atrovent/incentive spirometry/oxygen supplementation as needed DVT prophylaxis Code Status: Full code Family Communication: Family members at bedside Disposition Plan: Home with home health services versus short-term rehabilitation   Consultants:  None  Procedures:  None  Antibiotics:  Ceftriaxone 08/16/14  Azithromycin 08/16/2014  HPI/Subjective: Not saying much. Family doing most of the talking on his behalf. Denies any specific complaints.  Objective: Filed Vitals:   08/16/14 1344  BP: 103/43  Pulse: 79  Temp: 99 F (37.2 C)  Resp: 16    Intake/Output Summary (Last 24 hours) at 08/16/14 1524 Last data filed at 08/16/14 1034  Gross per 24 hour  Intake    350 ml  Output   1000 ml  Net    -650 ml   Filed Weights   08/16/14 0020  Weight: 55.384 kg (122 lb 1.6 oz)    Exam:   General:  Frail, lethargic  Cardiovascular: S1-S2 normal. No murmurs. RRR.  Respiratory: Bilateral transmitted sounds, wheezing, wet cough.  Abdomen: Soft and nontender. Normal bowel sounds.  Musculoskeletal: No pedal edema.   Data Reviewed: Basic Metabolic Panel:  Recent Labs Lab 08/15/14 1905 08/16/14 0925  NA 138 140  K 3.9 4.0  CL 101 105  CO2 28 25  GLUCOSE 133* 129*  BUN 18 13  CREATININE 1.00 0.83  CALCIUM 8.9 8.6   Liver Function Tests:  Recent Labs Lab 08/15/14 1905 08/16/14 0925  AST 73* 45*  ALT 41 29  ALKPHOS 200* 160*  BILITOT 0.8 1.1  PROT 8.1 6.5  ALBUMIN 3.4* 2.7*   No results for input(s): LIPASE, AMYLASE in the last 168 hours. No results for input(s): AMMONIA in the last 168 hours. CBC:  Recent Labs Lab 08/15/14 1905 08/16/14 0925  WBC 12.3* 6.9  NEUTROABS 10.9*  --   HGB 12.3* 11.5*  HCT 37.2* 35.5*  MCV 93.2 94.4  PLT 374 331   Cardiac Enzymes:  Recent Labs Lab 08/16/14 0132 08/16/14 0925  TROPONINI 0.04* 0.03   BNP (last 3 results) No results for input(s): PROBNP in the last 8760 hours. CBG: No results for input(s): GLUCAP in the last 168 hours.  No results found for this or any previous visit (from the past 240 hour(s)).   Studies: Dg Chest 2 View  08/15/2014   CLINICAL DATA:  Status post fall at home. Hit head. Right-sided weakness.  EXAM: CHEST  2 VIEW  COMPARISON:  09/02/2011, 04/05/2013  FINDINGS: There is right upper lobe and left basilar chronic interstitial disease. There is right basilar airspace disease which may  reflect atelectasis versus scarring.  No focal consolidation, pleural effusion or pneumothorax.  Stable cardiomediastinal silhouette.  Possible left anterior lateral seventh and eighth rib fractures.  IMPRESSION: No active cardiopulmonary disease.  Possible left anterior lateral seventh and eighth rib fractures.    Electronically Signed   By: Elige Ko   On: 08/15/2014 20:31   Dg Pelvis 1-2 Views  08/15/2014   CLINICAL DATA:  Status post fall  EXAM: PELVIS - 1-2 VIEW  COMPARISON:  None.  FINDINGS: Generalized osteopenia. No fracture or dislocation. Mild degenerative changes of bilateral SI joints. Peripheral vascular atherosclerotic disease.  IMPRESSION: No acute osseous injury of the pelvis.   Electronically Signed   By: Elige Ko   On: 08/15/2014 20:32   Ct Head Wo Contrast  08/15/2014   CLINICAL DATA:  Status post fall but did not lose consciousness after falling.  EXAM: CT HEAD WITHOUT CONTRAST  CT CERVICAL SPINE WITHOUT CONTRAST  TECHNIQUE: Multidetector CT imaging of the head and cervical spine was performed following the standard protocol without intravenous contrast. Multiplanar CT image reconstructions of the cervical spine were also generated.  COMPARISON:  None.  FINDINGS: CT HEAD FINDINGS  There is no evidence of mass effect, midline shift, or extra-axial fluid collections. There is no evidence of a space-occupying lesion or intracranial hemorrhage. There is no evidence of a cortical-based area of acute infarction. There is a large left MCA territory infarct with encephalomalacia. There is generalized cerebral atrophy. There is periventricular white matter low attenuation likely secondary to microangiopathy.  The ventricles and sulci are appropriate for the patient's age. The basal cisterns are patent.  Visualized portions of the orbits are unremarkable. The visualized portions of the paranasal sinuses and mastoid air cells are unremarkable. Cerebrovascular atherosclerotic calcifications are noted.  The osseous structures are unremarkable.  CT CERVICAL SPINE FINDINGS  The alignment is anatomic. The vertebral body heights are maintained. There is no acute fracture. There is no static listhesis. The prevertebral soft tissues are normal. The intraspinal soft tissues are not fully imaged on this  examination due to poor soft tissue contrast, but there is no gross soft tissue abnormality.  There is degenerative disc disease at C3-4, C5-6 and C6-7. There is osseous fusion of the right posterior elements at C3-4. There is severe right and moderate left facet arthropathy at C2-3 with bilateral foraminal narrowing. There is bilateral facet arthropathy, right worse than left, at C3-4 with bilateral uncovertebral degenerative changes resulting in severe bilateral foraminal encroachment. Bilateral facet arthropathy at C4-5. Bilateral facet arthropathy at C5-6 with a broad-based disc osteophyte complex and bilateral uncovertebral degenerative changes resulting in foraminal encroachment.  There is right carotid artery atherosclerosis. There is a left carotid artery stent. There is a 10 mm spiculated subpleural right upper lobe pulmonary nodule which may reflect an area of scarring. There are right apical blebs.  IMPRESSION: 1. No acute intracranial pathology. 2. No acute osseous injury of the cervical spine. 3. 10 mm spiculated subpleural right upper lobe pulmonary nodule which is more prominent compared with CT chest dated 09/09/2011. A dedicated non emergent CT of the chest is recommended for further evaluation. This may reflect scarring versus malignancy.   Electronically Signed   By: Elige Ko   On: 08/15/2014 20:15   Ct Cervical Spine Wo Contrast  08/15/2014   CLINICAL DATA:  Status post fall but did not lose consciousness after falling.  EXAM: CT HEAD WITHOUT CONTRAST  CT CERVICAL SPINE WITHOUT CONTRAST  TECHNIQUE:  Multidetector CT imaging of the head and cervical spine was performed following the standard protocol without intravenous contrast. Multiplanar CT image reconstructions of the cervical spine were also generated.  COMPARISON:  None.  FINDINGS: CT HEAD FINDINGS  There is no evidence of mass effect, midline shift, or extra-axial fluid collections. There is no evidence of a space-occupying lesion or  intracranial hemorrhage. There is no evidence of a cortical-based area of acute infarction. There is a large left MCA territory infarct with encephalomalacia. There is generalized cerebral atrophy. There is periventricular white matter low attenuation likely secondary to microangiopathy.  The ventricles and sulci are appropriate for the patient's age. The basal cisterns are patent.  Visualized portions of the orbits are unremarkable. The visualized portions of the paranasal sinuses and mastoid air cells are unremarkable. Cerebrovascular atherosclerotic calcifications are noted.  The osseous structures are unremarkable.  CT CERVICAL SPINE FINDINGS  The alignment is anatomic. The vertebral body heights are maintained. There is no acute fracture. There is no static listhesis. The prevertebral soft tissues are normal. The intraspinal soft tissues are not fully imaged on this examination due to poor soft tissue contrast, but there is no gross soft tissue abnormality.  There is degenerative disc disease at C3-4, C5-6 and C6-7. There is osseous fusion of the right posterior elements at C3-4. There is severe right and moderate left facet arthropathy at C2-3 with bilateral foraminal narrowing. There is bilateral facet arthropathy, right worse than left, at C3-4 with bilateral uncovertebral degenerative changes resulting in severe bilateral foraminal encroachment. Bilateral facet arthropathy at C4-5. Bilateral facet arthropathy at C5-6 with a broad-based disc osteophyte complex and bilateral uncovertebral degenerative changes resulting in foraminal encroachment.  There is right carotid artery atherosclerosis. There is a left carotid artery stent. There is a 10 mm spiculated subpleural right upper lobe pulmonary nodule which may reflect an area of scarring. There are right apical blebs.  IMPRESSION: 1. No acute intracranial pathology. 2. No acute osseous injury of the cervical spine. 3. 10 mm spiculated subpleural right upper  lobe pulmonary nodule which is more prominent compared with CT chest dated 09/09/2011. A dedicated non emergent CT of the chest is recommended for further evaluation. This may reflect scarring versus malignancy.   Electronically Signed   By: Elige KoHetal  Patel   On: 08/15/2014 20:15    Scheduled Meds: . acetaminophen  650 mg Oral BID  . aspirin EC  81 mg Oral Daily  . azithromycin  500 mg Intravenous Q24H  . cefTRIAXone (ROCEPHIN)  IV  1 g Intravenous Q24H  . donepezil  10 mg Oral QHS  . folic acid  1 mg Oral Daily  . furosemide  20 mg Oral Daily  . ipratropium-albuterol  3 mL Nebulization Q4H  . metoprolol tartrate  25 mg Oral BID  . mirtazapine  15 mg Oral QHS  . multivitamin with minerals  1 tablet Oral Daily  . pantoprazole  40 mg Oral Daily  . [START ON 08/17/2014] predniSONE  30 mg Oral Q breakfast  . predniSONE  40 mg Oral Once  . simvastatin  20 mg Oral QHS  . thiamine  100 mg Oral Daily   Continuous Infusions:     Time spent: 35 minutes.    Babygirl Trager  [facility] Pager [phone]. If 7PM-7AM, please contact night-coverage at [email or web address], password [facility] [email or web address] 08/16/2014, 3:24 PM  LOS: 1 day

## 2014-08-16 NOTE — Progress Notes (Signed)
Evaluated pt pre-treatment. Pt with BBS coarse crackles. Possible fluid overload. Expressed concern to RN. RT will continue to monitor.

## 2014-08-17 ENCOUNTER — Observation Stay (HOSPITAL_COMMUNITY): Payer: Medicare Other

## 2014-08-17 DIAGNOSIS — Z7982 Long term (current) use of aspirin: Secondary | ICD-10-CM | POA: Diagnosis not present

## 2014-08-17 DIAGNOSIS — S060X1A Concussion with loss of consciousness of 30 minutes or less, initial encounter: Secondary | ICD-10-CM | POA: Diagnosis present

## 2014-08-17 DIAGNOSIS — R0902 Hypoxemia: Secondary | ICD-10-CM | POA: Diagnosis present

## 2014-08-17 DIAGNOSIS — J8410 Pulmonary fibrosis, unspecified: Secondary | ICD-10-CM | POA: Diagnosis present

## 2014-08-17 DIAGNOSIS — F0390 Unspecified dementia without behavioral disturbance: Secondary | ICD-10-CM | POA: Diagnosis present

## 2014-08-17 DIAGNOSIS — T82858A Stenosis of vascular prosthetic devices, implants and grafts, initial encounter: Secondary | ICD-10-CM | POA: Diagnosis present

## 2014-08-17 DIAGNOSIS — Z9181 History of falling: Secondary | ICD-10-CM | POA: Diagnosis not present

## 2014-08-17 DIAGNOSIS — Z79899 Other long term (current) drug therapy: Secondary | ICD-10-CM | POA: Diagnosis not present

## 2014-08-17 DIAGNOSIS — Q644 Malformation of urachus: Secondary | ICD-10-CM | POA: Diagnosis not present

## 2014-08-17 DIAGNOSIS — W1830XA Fall on same level, unspecified, initial encounter: Secondary | ICD-10-CM | POA: Diagnosis present

## 2014-08-17 DIAGNOSIS — S066X1A Traumatic subarachnoid hemorrhage with loss of consciousness of 30 minutes or less, initial encounter: Secondary | ICD-10-CM | POA: Diagnosis present

## 2014-08-17 DIAGNOSIS — R159 Full incontinence of feces: Secondary | ICD-10-CM | POA: Diagnosis present

## 2014-08-17 DIAGNOSIS — I714 Abdominal aortic aneurysm, without rupture: Secondary | ICD-10-CM | POA: Diagnosis present

## 2014-08-17 DIAGNOSIS — H3561 Retinal hemorrhage, right eye: Secondary | ICD-10-CM | POA: Diagnosis present

## 2014-08-17 DIAGNOSIS — I6523 Occlusion and stenosis of bilateral carotid arteries: Secondary | ICD-10-CM | POA: Diagnosis present

## 2014-08-17 DIAGNOSIS — R740 Nonspecific elevation of levels of transaminase and lactic acid dehydrogenase [LDH]: Secondary | ICD-10-CM | POA: Diagnosis present

## 2014-08-17 DIAGNOSIS — Z87891 Personal history of nicotine dependence: Secondary | ICD-10-CM | POA: Diagnosis not present

## 2014-08-17 DIAGNOSIS — S2242XA Multiple fractures of ribs, left side, initial encounter for closed fracture: Secondary | ICD-10-CM | POA: Diagnosis present

## 2014-08-17 DIAGNOSIS — I69351 Hemiplegia and hemiparesis following cerebral infarction affecting right dominant side: Secondary | ICD-10-CM | POA: Diagnosis not present

## 2014-08-17 DIAGNOSIS — I10 Essential (primary) hypertension: Secondary | ICD-10-CM | POA: Diagnosis present

## 2014-08-17 DIAGNOSIS — E785 Hyperlipidemia, unspecified: Secondary | ICD-10-CM | POA: Diagnosis present

## 2014-08-17 DIAGNOSIS — J441 Chronic obstructive pulmonary disease with (acute) exacerbation: Secondary | ICD-10-CM | POA: Diagnosis present

## 2014-08-17 LAB — GLUCOSE, CAPILLARY: Glucose-Capillary: 97 mg/dL (ref 70–99)

## 2014-08-17 LAB — HEMOGLOBIN A1C
HEMOGLOBIN A1C: 6.1 % — AB (ref 4.8–5.6)
MEAN PLASMA GLUCOSE: 128 mg/dL

## 2014-08-17 MED ORDER — CETYLPYRIDINIUM CHLORIDE 0.05 % MT LIQD
7.0000 mL | Freq: Two times a day (BID) | OROMUCOSAL | Status: DC
  Administered 2014-08-17 – 2014-08-21 (×7): 7 mL via OROMUCOSAL

## 2014-08-17 MED ORDER — SODIUM CHLORIDE 0.9 % IV SOLN
INTRAVENOUS | Status: AC
  Administered 2014-08-17 – 2014-08-19 (×3): via INTRAVENOUS

## 2014-08-17 MED ORDER — IPRATROPIUM-ALBUTEROL 0.5-2.5 (3) MG/3ML IN SOLN
3.0000 mL | RESPIRATORY_TRACT | Status: DC | PRN
  Administered 2014-08-17 – 2014-08-20 (×3): 3 mL via RESPIRATORY_TRACT
  Filled 2014-08-17 (×3): qty 3

## 2014-08-17 MED ORDER — AZITHROMYCIN 500 MG PO TABS
500.0000 mg | ORAL_TABLET | ORAL | Status: AC
  Administered 2014-08-17 – 2014-08-18 (×2): 500 mg via ORAL
  Filled 2014-08-17 (×2): qty 1

## 2014-08-17 NOTE — Progress Notes (Signed)
PHARMACIST - PHYSICIAN COMMUNICATION DR:   Venetia Constableanga CONCERNING: Antibiotic IV to Oral Route Change Policy  RECOMMENDATION: This patient is receiving azithromycin by the intravenous route.  Based on criteria approved by the Pharmacy and Therapeutics Committee, the antibiotic(s) is/are being converted to the equivalent oral dose form(s).   DESCRIPTION: These criteria include:  Patient being treated for a respiratory tract infection, urinary tract infection, cellulitis or clostridium difficile associated diarrhea if on metronidazole  The patient is not neutropenic and does not exhibit a GI malabsorption state  The patient is eating (either orally or via tube) and/or has been taking other orally administered medications for a least 24 hours  The patient is improving clinically and has a Tmax < 100.5  If you have questions about this conversion, please contact the Pharmacy Department  []   [phone]( [phone] )  [facility] [x]   [phone]( [phone] )  [facility]  []   [phone]( [phone] )  [facility] []   [phone]( [phone] )  [facility]   Dorna LeitzAnh Shevawn Langenberg, PharmD, BCPS

## 2014-08-17 NOTE — Progress Notes (Signed)
UR completed 

## 2014-08-17 NOTE — Evaluation (Signed)
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 Pt currently with functional limitations due to the deficits listed below (see PT Problem List). Family present and desperately want pt to return home where they provide 24/7 care. Pt with dementia and expressive aphasia and clearly responds to family members best. Discussed with daughter utilizing undergarments for incontinence to reduce risk of similar slip/fall again. She agreed to this and to HHPT for home safety evaluation. Pt will benefit from skilled PT to increase their independence and safety with mobility to allow discharge to the venue listed below.       Follow Up Recommendations Home health PT;Supervision/Assistance - 24 hour    Equipment Recommendations  None recommended by PT    Recommendations for Other Services       Precautions / Restrictions Precautions Precautions: Fall Restrictions Weight Bearing Restrictions: No      Mobility  Bed Mobility Overal bed mobility: Needs Assistance Bed Mobility: Supine to Sit     Supine to sit: Min assist;HOB elevated     General bed mobility comments: pt does not perform at home (sleeps in recliner); min assist to raise torso  Transfers Overall transfer level: Needs assistance Equipment used: 1 person hand held assist Transfers: Sit to/from Stand Sit to Stand: Min assist         General transfer comment: held grandaughter's hand to come to stand  Ambulation/Gait Ambulation/Gait assistance: Min guard Ambulation Distance (Feet): 12 Feet Assistive device: 1 person hand held assist Gait  Pattern/deviations: Step-through pattern;Decreased weight shift to right;Decreased weight shift to left;Shuffle     General Gait Details: shuffling gait is his baseline per family; refuses to use a cane or RW at home  Stairs            Wheelchair Mobility    Modified Rankin (Stroke Patients Only)       Balance Overall balance assessment: Needs assistance Sitting-balance support: No upper extremity supported;Feet supported Sitting balance-Leahy Scale: Fair     Standing balance support: Single extremity supported Standing balance-Leahy Scale: Poor Standing balance comment: stood slightly unsteady initially with assist of grandaughter on his Rt side                             Pertinent Vitals/Pain Pain Assessment: Faces Faces Pain Scale: Hurts even more Pain Location: unclear Pain Intervention(s): Limited activity within patient's tolerance;Monitored during session;Repositioned    Home Living Family/patient expects to be discharged to:: Private residence Living Arrangements: Children Available Help at Discharge: Family;Available 24 hours/day Type of Home: House Home Access: Stairs to enter Entrance Stairs-Rails: Right;Left;Can reach both Entrance Stairs-Number of Steps: 2 Home Layout: One level Home Equipment: Walker - 2 wheels;Bedside commode (Lift chair) Additional Comments: Daughter lives with him; multiple family members near by    Prior Function Level of Independence: Independent         Comments: sleeps in recliner; used no device; recent 2 weeks became incontinent of bowels     Hand Dominance        Extremity/Trunk Assessment   Upper Extremity Assessment: Generalized  weakness (Rt worse than Lt; grossly assessed due to skin tears)           Lower Extremity Assessment: Generalized weakness (Rt worse than left)      Cervical / Trunk Assessment: Kyphotic  Communication   Communication: Expressive difficulties;HOH  Cognition  Arousal/Alertness: Lethargic (awakened on arrival) Behavior During Therapy: WFL for tasks assessed/performed Overall Cognitive Status: History of cognitive impairments - at baseline                      General Comments General comments (skin integrity, edema, etc.): Daughter and grandaughter present    Exercises        Assessment/Plan    PT Assessment All further PT needs can be met in the next venue of care  PT Diagnosis Generalized weakness   PT Problem List Decreased strength;Decreased balance;Decreased mobility;Decreased cognition;Decreased knowledge of use of DME;Decreased safety awareness;Pain  PT Treatment Interventions     PT Goals (Current goals can be found in the Care Plan section) Acute Rehab PT Goals Patient Stated Goal: Daughter wants pt to return home PT Goal Formulation: With family Time For Goal Achievement: 08/24/14 Potential to Achieve Goals: Good    Frequency     Barriers to discharge        Co-evaluation               End of Session   Activity Tolerance: Patient tolerated treatment well Patient left: in chair;with call bell/phone within reach;with chair alarm set;with family/visitor present Nurse Communication: Mobility status;Other (comment) (refused to wear O2)    Functional Assessment Tool Used: clinical judgement Functional Limitation: Mobility: Walking and moving around Mobility: Walking and Moving Around Current Status [phone]): At least 20 percent but less than 40 percent impaired, limited or restricted Mobility: Walking and Moving Around Goal Status [phone]): At least 1 percent but less than 20 percent impaired, limited or restricted    Time: 1153-1226 PT Time Calculation (min) (ACUTE ONLY): 33 min   Charges:   PT Evaluation $Initial PT Evaluation Tier I: 1 Procedure PT Treatments $Gait Training: 8-22 mins   PT G Codes:   PT G-Codes **NOT FOR INPATIENT CLASS** Functional Assessment Tool Used: clinical  judgement Functional Limitation: Mobility: Walking and moving around Mobility: Walking and Moving Around Current Status (T6256): At least 20 percent but less than 40 percent impaired, limited or restricted Mobility: Walking and Moving Around Goal Status [phone]): At least 1 percent but less than 20 percent impaired, limited or restricted    Bobbye Petti 08/17/2014, 12:42 PM Pager [phone]

## 2014-08-17 NOTE — Consult Note (Signed)
WOC wound consult note Reason for Consult: Consult requested for left and right arm skin tears.  Pt fell prior to admission. Wound type: Full thickness skin tears Measurement: Left upper arm: 6X2X.1cm; skin flap approximated over 85% of wound, 15% red moist exposed wound bed.  Minimal amt pink drainage, no odor. Right elbow: 8X2X.1cm; skin flap approximated over 70% of wound, 30% red moist exposed wound bed.  Minimal amt pink drainage, no odor.  Attempted to re-adhere skin flap over wound as much as possible and hold in place with steristrips.  These can remain in place until they dry up and fall off. This is a difficult location to promote healing R/T constant motion over joint. Dressing procedure/placement/frequency: Silicone foam dressing to protect and promote healing.  Discussed with family member at bedside; they verbalize understanding. Please re-consult if further assistance is needed.  Thank-you,  Cammie Mcgeeawn Analysse Quinonez MSN, RN, CWOCN, ScrevenWCN-AP, CNS [phone]

## 2014-08-17 NOTE — Progress Notes (Signed)
Late entry: Around 1950, RT stated that coarse crackles in pt's lungs could be heard bilaterally and that pt may be experiencing fluid overload at that time. VS stable, Resp 20, oxygen sat 96 on 2 L [place]. Pt did not appear to be in any distress at that time. Maren ReamerKaren Kirby, NP paged about change in lung sounds and worsening of crackles. Maren ReamerKaren Kirby, NP ordered Lasix 20 mg PO to be given once. Lasix given at 2204. Lungs reassessed around 2315; lung sounds have improved. Will continue to monitor. Salvadore OxfordJessica Yosiah Jasmin, RN 08/17/2014 0400

## 2014-08-17 NOTE — Progress Notes (Signed)
PT Cancellation Note  Patient Details Name: James Mcneil MRN: [id number] DOB: 02/14/1923   Cancelled Treatment:    Reason Eval/Treat Not Completed: Pt on strict bedrest.   Will contact physician re: increasing activity orders to allow full PT evaluation.   [name] 08/17/2014, 11:27 AM  Pager [phone]

## 2014-08-17 NOTE — Progress Notes (Addendum)
[facility] PROGRESS NOTE  LORCAN SHELP ZOX:[id number] DOB: [birth year] DOA: 08/15/2014 PCP: Rene Paci, MD  Summary James Mcneil is a pleasant 81 y.o. male with COPD/dementia/essential hypertension/hyperlipidemia, among other medical problems who presented after a fall associated with bladder and bowel incontinence as well as altered sensorium. His workup in the ED was unremarkable but he has since developed low-grade fever and respiratory symptoms suggesting acute exacerbation of COPD(white count 12,000 on the day of admission), which may have caused the fall. There was concern for broken ribs. Repeat chest x-ray(post hydration) suggests chronic pulmonary fibrosis not mentioned on x-ray day of admission. I suspect this changes are due to aspiration pneumonitis. We will therefore continue antibiotics/systemic steroids/bronchodilators and oxygen supplementation as needed and incentive spirometer. Patient's family also mention that his right side is weaker. I cannot get him to squeeze my arms. We'll therefore obtain brain MRI to ensure no acute intracranial event. Not sure why he would have stool and urinary incontinence. Patient also has some transaminitis probably pointing towards ischemic liver. We'll repeat liver enzymes in a.m., if elevated consider abdominal ultrasound. Will hold simvastatin for now. Plan Fall/Encephalopathy due to infection/Late effects of cerebrovascular disease/Dementia  Vitamin B12 level normal.  Obtain brain MRI  Physical therapy recommended Home PT with 24 hours supervision. COPD exacerbation/Multiple rib fractures  Repeat chest x-ray shows "1. Mildly displaced distal RIGHT clavicle fracture. 2. No acute cardiopulmonary disease. Chronic pulmonary fibrosis"  Ceftriaxone/azithromycin/prednisone/albuterol/Atrovent/incentive spirometry/oxygen supplementation as needed. If patient continues to have respiratory symptoms, consider CT chest. DVT  prophylaxis Code Status: Full code Family Communication: Family members at bedside Disposition Plan: Home with home health services versus short-term rehabilitation   Consultants:  None  Procedures:  None  Antibiotics:  Ceftriaxone 08/16/14>  Azithromycin 08/16/2014>  HPI/Subjective: "So, so". Mostly sleeping  Objective: Filed Vitals:   08/17/14 1433  BP: 113/53  Pulse: 95  Temp: 98.2 F (36.8 C)  Resp: 20    Intake/Output Summary (Last 24 hours) at 08/17/14 1648 Last data filed at 08/17/14 0900  Gross per 24 hour  Intake    360 ml  Output   1250 ml  Net   -890 ml   Filed Weights   08/16/14 0020  Weight: 55.384 kg (122 lb 1.6 oz)    Exam:   General:  Frail  Cardiovascular: Regular tachycardia  Respiratory: Deep, wet cough. Scant wheezing. Bilateral transmitted sounds.  Abdomen: Soft and nontender. No murmurs.  Musculoskeletal: No pedal edema.   Data Reviewed: Basic Metabolic Panel:  Recent Labs Lab 08/15/14 1905 08/16/14 0925  NA 138 140  K 3.9 4.0  CL 101 105  CO2 28 25  GLUCOSE 133* 129*  BUN 18 13  CREATININE 1.00 0.83  CALCIUM 8.9 8.6   Liver Function Tests:  Recent Labs Lab 08/15/14 1905 08/16/14 0925  AST 73* 45*  ALT 41 29  ALKPHOS 200* 160*  BILITOT 0.8 1.1  PROT 8.1 6.5  ALBUMIN 3.4* 2.7*   No results for input(s): LIPASE, AMYLASE in the last 168 hours. No results for input(s): AMMONIA in the last 168 hours. CBC:  Recent Labs Lab 08/15/14 1905 08/16/14 0925  WBC 12.3* 6.9  NEUTROABS 10.9*  --   HGB 12.3* 11.5*  HCT 37.2* 35.5*  MCV 93.2 94.4  PLT 374 331   Cardiac Enzymes:  Recent Labs Lab 08/16/14 0132 08/16/14 0925 08/16/14 1555  TROPONINI 0.04* 0.03 0.03   BNP (last 3 results) No results for input(s): PROBNP in the last 8760  hours. CBG:  Recent Labs Lab 08/16/14 0655  GLUCAP 97    No results found for this or any previous visit (from the past 240 hour(s)).   Studies: Dg Chest 2  View  08/15/2014   CLINICAL DATA:  Status post fall at home. Hit head. Right-sided weakness.  EXAM: CHEST  2 VIEW  COMPARISON:  09/02/2011, 04/05/2013  FINDINGS: There is right upper lobe and left basilar chronic interstitial disease. There is right basilar airspace disease which may reflect atelectasis versus scarring.  No focal consolidation, pleural effusion or pneumothorax.  Stable cardiomediastinal silhouette.  Possible left anterior lateral seventh and eighth rib fractures.  IMPRESSION: No active cardiopulmonary disease.  Possible left anterior lateral seventh and eighth rib fractures.   Electronically Signed   By: Elige Ko   On: 08/15/2014 20:31   Dg Pelvis 1-2 Views  08/15/2014   CLINICAL DATA:  Status post fall  EXAM: PELVIS - 1-2 VIEW  COMPARISON:  None.  FINDINGS: Generalized osteopenia. No fracture or dislocation. Mild degenerative changes of bilateral SI joints. Peripheral vascular atherosclerotic disease.  IMPRESSION: No acute osseous injury of the pelvis.   Electronically Signed   By: Elige Ko   On: 08/15/2014 20:32   Ct Head Wo Contrast  08/15/2014   CLINICAL DATA:  Status post fall but did not lose consciousness after falling.  EXAM: CT HEAD WITHOUT CONTRAST  CT CERVICAL SPINE WITHOUT CONTRAST  TECHNIQUE: Multidetector CT imaging of the head and cervical spine was performed following the standard protocol without intravenous contrast. Multiplanar CT image reconstructions of the cervical spine were also generated.  COMPARISON:  None.  FINDINGS: CT HEAD FINDINGS  There is no evidence of mass effect, midline shift, or extra-axial fluid collections. There is no evidence of a space-occupying lesion or intracranial hemorrhage. There is no evidence of a cortical-based area of acute infarction. There is a large left MCA territory infarct with encephalomalacia. There is generalized cerebral atrophy. There is periventricular white matter low attenuation likely secondary to microangiopathy.  The  ventricles and sulci are appropriate for the patient's age. The basal cisterns are patent.  Visualized portions of the orbits are unremarkable. The visualized portions of the paranasal sinuses and mastoid air cells are unremarkable. Cerebrovascular atherosclerotic calcifications are noted.  The osseous structures are unremarkable.  CT CERVICAL SPINE FINDINGS  The alignment is anatomic. The vertebral body heights are maintained. There is no acute fracture. There is no static listhesis. The prevertebral soft tissues are normal. The intraspinal soft tissues are not fully imaged on this examination due to poor soft tissue contrast, but there is no gross soft tissue abnormality.  There is degenerative disc disease at C3-4, C5-6 and C6-7. There is osseous fusion of the right posterior elements at C3-4. There is severe right and moderate left facet arthropathy at C2-3 with bilateral foraminal narrowing. There is bilateral facet arthropathy, right worse than left, at C3-4 with bilateral uncovertebral degenerative changes resulting in severe bilateral foraminal encroachment. Bilateral facet arthropathy at C4-5. Bilateral facet arthropathy at C5-6 with a broad-based disc osteophyte complex and bilateral uncovertebral degenerative changes resulting in foraminal encroachment.  There is right carotid artery atherosclerosis. There is a left carotid artery stent. There is a 10 mm spiculated subpleural right upper lobe pulmonary nodule which may reflect an area of scarring. There are right apical blebs.  IMPRESSION: 1. No acute intracranial pathology. 2. No acute osseous injury of the cervical spine. 3. 10 mm spiculated subpleural right upper lobe pulmonary  nodule which is more prominent compared with CT chest dated 09/09/2011. A dedicated non emergent CT of the chest is recommended for further evaluation. This may reflect scarring versus malignancy.   Electronically Signed   By: Elige Ko   On: 08/15/2014 20:15   Ct Cervical  Spine Wo Contrast  08/15/2014   CLINICAL DATA:  Status post fall but did not lose consciousness after falling.  EXAM: CT HEAD WITHOUT CONTRAST  CT CERVICAL SPINE WITHOUT CONTRAST  TECHNIQUE: Multidetector CT imaging of the head and cervical spine was performed following the standard protocol without intravenous contrast. Multiplanar CT image reconstructions of the cervical spine were also generated.  COMPARISON:  None.  FINDINGS: CT HEAD FINDINGS  There is no evidence of mass effect, midline shift, or extra-axial fluid collections. There is no evidence of a space-occupying lesion or intracranial hemorrhage. There is no evidence of a cortical-based area of acute infarction. There is a large left MCA territory infarct with encephalomalacia. There is generalized cerebral atrophy. There is periventricular white matter low attenuation likely secondary to microangiopathy.  The ventricles and sulci are appropriate for the patient's age. The basal cisterns are patent.  Visualized portions of the orbits are unremarkable. The visualized portions of the paranasal sinuses and mastoid air cells are unremarkable. Cerebrovascular atherosclerotic calcifications are noted.  The osseous structures are unremarkable.  CT CERVICAL SPINE FINDINGS  The alignment is anatomic. The vertebral body heights are maintained. There is no acute fracture. There is no static listhesis. The prevertebral soft tissues are normal. The intraspinal soft tissues are not fully imaged on this examination due to poor soft tissue contrast, but there is no gross soft tissue abnormality.  There is degenerative disc disease at C3-4, C5-6 and C6-7. There is osseous fusion of the right posterior elements at C3-4. There is severe right and moderate left facet arthropathy at C2-3 with bilateral foraminal narrowing. There is bilateral facet arthropathy, right worse than left, at C3-4 with bilateral uncovertebral degenerative changes resulting in severe bilateral  foraminal encroachment. Bilateral facet arthropathy at C4-5. Bilateral facet arthropathy at C5-6 with a broad-based disc osteophyte complex and bilateral uncovertebral degenerative changes resulting in foraminal encroachment.  There is right carotid artery atherosclerosis. There is a left carotid artery stent. There is a 10 mm spiculated subpleural right upper lobe pulmonary nodule which may reflect an area of scarring. There are right apical blebs.  IMPRESSION: 1. No acute intracranial pathology. 2. No acute osseous injury of the cervical spine. 3. 10 mm spiculated subpleural right upper lobe pulmonary nodule which is more prominent compared with CT chest dated 09/09/2011. A dedicated non emergent CT of the chest is recommended for further evaluation. This may reflect scarring versus malignancy.   Electronically Signed   By: Elige Ko   On: 08/15/2014 20:15   Dg Chest Port 1 View  08/16/2014   CLINICAL DATA:  Short of breath and cough for 2 days. Subsequent encounter.  EXAM: PORTABLE CHEST - 1 VIEW  COMPARISON:  08/15/2014.  Chest CT 09/09/2011.  FINDINGS: Chronic pulmonary fibrosis. Pulmonary parenchymal scarring. No pneumothorax. Cardiopericardial silhouette appears within normal limits. Mild tortuosity of the thoracic aorta with arch atherosclerosis. No displaced rib fractures are identified.  There is a mildly displaced fracture of the distal third of the RIGHT clavicle. No angulation is evident. LEFT-greater-than-RIGHT AC joint osteoarthritis. Visible proximal humerus appears intact.  Sclerosis in the proximal RIGHT humerus probably represents a bone infarct in this patient with interstitial lung disease. No  pleural effusion.  IMPRESSION: 1. Mildly displaced distal RIGHT clavicle fracture. 2. No acute cardiopulmonary disease.  Chronic pulmonary fibrosis.   Electronically Signed   By: Andreas NewportGeoffrey  Lamke M.D.   On: 08/16/2014 15:39    Scheduled Meds: . acetaminophen  650 mg Oral BID  . antiseptic oral  rinse  7 mL Mouth Rinse BID  . aspirin EC  81 mg Oral Daily  . azithromycin  500 mg Oral Q24H  . cefTRIAXone (ROCEPHIN)  IV  1 g Intravenous Q24H  . donepezil  10 mg Oral QHS  . folic acid  1 mg Oral Daily  . furosemide  20 mg Oral Daily  . metoprolol tartrate  25 mg Oral BID  . mirtazapine  15 mg Oral QHS  . multivitamin with minerals  1 tablet Oral Daily  . pantoprazole  40 mg Oral Daily  . predniSONE  30 mg Oral Q breakfast  . simvastatin  20 mg Oral QHS  . thiamine  100 mg Oral Daily   Continuous Infusions: . sodium chloride      Active Problems:   Fall   Encephalopathy due to infection   COPD exacerbation   Late effects of cerebrovascular disease   Dementia   Multiple rib fractures    Time spent: 30 minutes.    Krislyn Donnan  [facility] Pager [phone]. If 7PM-7AM, please contact night-coverage at [email or web address], password [email or web address] 08/17/2014, 4:48 PM  LOS: 2 days

## 2014-08-18 DIAGNOSIS — I639 Cerebral infarction, unspecified: Secondary | ICD-10-CM | POA: Diagnosis present

## 2014-08-18 DIAGNOSIS — R740 Nonspecific elevation of levels of transaminase and lactic acid dehydrogenase [LDH]: Secondary | ICD-10-CM

## 2014-08-18 DIAGNOSIS — R7401 Elevation of levels of liver transaminase levels: Secondary | ICD-10-CM | POA: Diagnosis present

## 2014-08-18 LAB — COMPREHENSIVE METABOLIC PANEL WITH GFR
ALT: 155 U/L — ABNORMAL HIGH (ref 0–53)
AST: 266 U/L — ABNORMAL HIGH (ref 0–37)
Albumin: 2.2 g/dL — ABNORMAL LOW (ref 3.5–5.2)
Alkaline Phosphatase: 193 U/L — ABNORMAL HIGH (ref 39–117)
Anion gap: 3 — ABNORMAL LOW (ref 5–15)
BUN: 21 mg/dL (ref 6–23)
CO2: 31 mmol/L (ref 19–32)
Calcium: 8.5 mg/dL (ref 8.4–10.5)
Chloride: 105 mmol/L (ref 96–112)
Creatinine, Ser: 0.86 mg/dL (ref 0.50–1.35)
GFR calc Af Amer: 87 mL/min — ABNORMAL LOW
GFR calc non Af Amer: 75 mL/min — ABNORMAL LOW
Glucose, Bld: 114 mg/dL — ABNORMAL HIGH (ref 70–99)
Potassium: 3.9 mmol/L (ref 3.5–5.1)
Sodium: 139 mmol/L (ref 135–145)
Total Bilirubin: 0.4 mg/dL (ref 0.3–1.2)
Total Protein: 6 g/dL (ref 6.0–8.3)

## 2014-08-18 LAB — CBC
HCT: 32.3 % — ABNORMAL LOW (ref 39.0–52.0)
Hemoglobin: 10.4 g/dL — ABNORMAL LOW (ref 13.0–17.0)
MCH: 30.4 pg (ref 26.0–34.0)
MCHC: 32.2 g/dL (ref 30.0–36.0)
MCV: 94.4 fL (ref 78.0–100.0)
Platelets: 329 K/uL (ref 150–400)
RBC: 3.42 MIL/uL — ABNORMAL LOW (ref 4.22–5.81)
RDW: 16.5 % — ABNORMAL HIGH (ref 11.5–15.5)
WBC: 12.5 K/uL — ABNORMAL HIGH (ref 4.0–10.5)

## 2014-08-18 LAB — GLUCOSE, CAPILLARY: GLUCOSE-CAPILLARY: 111 mg/dL — AB (ref 70–99)

## 2014-08-18 MED ORDER — IOHEXOL 300 MG/ML  SOLN
25.0000 mL | INTRAMUSCULAR | Status: AC
  Administered 2014-08-18 (×2): 25 mL via ORAL

## 2014-08-18 NOTE — Progress Notes (Signed)
[facility] PROGRESS NOTE  James RedWilliam R Mcneil ZOX:[id number] DOB: 02/09/1923 DOA: 08/15/2014 PCP: James PaciValerie Leschber, MD  Summary James Mcneil is a pleasant 81 y.o. male with COPD/dementia/essential hypertension/hyperlipidemia/Old CVA which are pending 2001 resulting in residual right-sided hemiaplagia , among other medical problems, who presented after a fall associated with bladder and bowel incontinence as well as altered sensorium. His workup in the ED was unremarkable but he has since developed low-grade fever and respiratory symptoms suggesting acute exacerbation of COPD(white count 12,000 on the day of admission), which may have caused the fall. There was concern for broken ribs. Repeat chest x-ray(post hydration) suggests chronic pulmonary fibrosis not mentioned on x-ray day of admission. I suspect these changes are due to aspiration pneumonitis. Patient is doing well on current antibiotics/systemic steroids/bronchodilators and oxygen supplementation as needed and incentive spirometer. Patient's family also mentioned that his right side is weaker prompting brain MRI which showed "Small acute infarction in the right parieto-occipital region without hemorrhage or mass effect. Old left MCA territory infarction". I could not get him to squeeze my arm with the right hand. He does not have demonstrable weakness on the left side. I wonder if the weakness on the right side is related to global issues. In any case, patient on aspirin which will be continued. Family mentioned that he has a carotid stent placed in 2001. Will obtain 2-D echocardiogram/carotid Doppler/lipid panel/hemoglobin A1c and consult speech therapy. His LFTs are not improving despite holding Zocor. Will therefore obtain CT abdomen and pelvis and keep holding Zocor. In light of the acute CVA, patient may still need short-term rehabilitation placement. Plan Fall/Encephalopathy due to infection/Late effects of cerebrovascular  disease/Dementia/acute right occipitoparietal CVA.  Continue aspirin. Statin on hold in view of transaminitis.  Obtain carotid Doppler/2-D echo.  Speech therapy evaluation.  Consult neurology in a.m. COPD exacerbation/Multiple rib fractures/transaminitis  Repeat chest x-ray shows "1. Mildly displaced distal RIGHT clavicle fracture. 2. No acute cardiopulmonary disease. Chronic pulmonary fibrosis"  Ceftriaxone/azithromycin/prednisone/albuterol/Atrovent/incentive spirometry/oxygen supplementation as needed. If patient continues to have respiratory symptoms, consider CT chest.  Obtain CT abdomen and pelvis. DVT prophylaxis Code Status: Full code Family Communication: Family members at bedside-2 daughters Disposition Plan: Home with home health services versus short-term rehabilitation   Consultants:  None  Procedures:  None  Antibiotics:  Ceftriaxone 08/16/14>  Azithromycin 08/16/2014>  HPI/Subjective: Feels ok. No complaints but patient still incontinent in urine.  Objective: Filed Vitals:   08/18/14 1809  BP: 120/52  Pulse: 89  Temp: 98.1 F (36.7 C)  Resp: 18    Intake/Output Summary (Last 24 hours) at 08/18/14 1831 Last data filed at 08/18/14 1716  Gross per 24 hour  Intake      0 ml  Output    425 ml  Net   -425 ml   Filed Weights   08/16/14 0020  Weight: 55.384 kg (122 lb 1.6 oz)    Exam:   General:  Appears more rested  Cardiovascular: RRR. S1-S2 normal. No murmurs.  Respiratory: Less wheezing.  Abdomen: Soft and nontender.  Musculoskeletal: No pedal edema.  Neurological- right-sided hemiplegia more so the right arm but patient can squeeze my him today.   Data Reviewed: Basic Metabolic Panel:  Recent Labs Lab 08/15/14 1905 08/16/14 0925 08/18/14 0645  NA 138 140 139  K 3.9 4.0 3.9  CL 101 105 105  CO2 28 25 31   GLUCOSE 133* 129* 114*  BUN 18 13 21   CREATININE 1.00 0.83 0.86  CALCIUM 8.9 8.6 8.5  Liver Function  Tests:  Recent Labs Lab 08/15/14 1905 08/16/14 0925 08/18/14 0645  AST 73* 45* 266*  ALT 41 29 155*  ALKPHOS 200* 160* 193*  BILITOT 0.8 1.1 0.4  PROT 8.1 6.5 6.0  ALBUMIN 3.4* 2.7* 2.2*   No results for input(s): LIPASE, AMYLASE in the last 168 hours. No results for input(s): AMMONIA in the last 168 hours. CBC:  Recent Labs Lab 08/15/14 1905 08/16/14 0925 08/18/14 0645  WBC 12.3* 6.9 12.5*  NEUTROABS 10.9*  --   --   HGB 12.3* 11.5* 10.4*  HCT 37.2* 35.5* 32.3*  MCV 93.2 94.4 94.4  PLT 374 331 329   Cardiac Enzymes:  Recent Labs Lab 08/16/14 0132 08/16/14 0925 08/16/14 1555  TROPONINI 0.04* 0.03 0.03   BNP (last 3 results) No results for input(s): PROBNP in the last 8760 hours. CBG:  Recent Labs Lab 08/16/14 0655 08/18/14 0639  GLUCAP 97 111*    No results found for this or any previous visit (from the past 240 hour(s)).   Studies: Mr Brain Wo Contrast  08/17/2014   CLINICAL DATA:  Dementia up. Right-sided weakness. Possible stroke.  EXAM: MRI HEAD WITHOUT CONTRAST  TECHNIQUE: Multiplanar, multiecho pulse sequences of the brain and surrounding structures were obtained without intravenous contrast.  COMPARISON:  Head CT 08/15/2014  FINDINGS: Diffusion imaging shows a small acute stroke in the right parieto-occipital region. No swelling, hemorrhage or mass effect. No other acute finding.  The brainstem is unremarkable. There is cerebellar atrophy without focal insult. The cerebral hemispheres elsewhere show old infarction in the left middle cerebral artery territory with atrophy, encephalomalacia and gliosis. No obstructive hydrocephalus. No extra-axial collection. No sign of mass lesion. No pituitary mass. Sinuses are clear. Small amount of fluid in the mastoid air cells bilaterally.  IMPRESSION: Small acute infarction in the right parieto-occipital region without hemorrhage or mass effect.  Old left MCA territory infarction.   Electronically Signed   By: Paulina Fusi M.D.   On: 08/17/2014 21:02    Scheduled Meds: . acetaminophen  650 mg Oral BID  . antiseptic oral rinse  7 mL Mouth Rinse BID  . aspirin EC  81 mg Oral Daily  . cefTRIAXone (ROCEPHIN)  IV  1 g Intravenous Q24H  . donepezil  10 mg Oral QHS  . folic acid  1 mg Oral Daily  . furosemide  20 mg Oral Daily  . metoprolol tartrate  25 mg Oral BID  . mirtazapine  15 mg Oral QHS  . multivitamin with minerals  1 tablet Oral Daily  . pantoprazole  40 mg Oral Daily  . predniSONE  30 mg Oral Q breakfast  . thiamine  100 mg Oral Daily   Continuous Infusions: . sodium chloride 50 mL/hr at 08/18/14 1529    Active Problems:   Fall   Encephalopathy due to infection   COPD exacerbation   Late effects of cerebrovascular disease   Dementia   Multiple rib fractures    Time spent: 20 minutes.     [place]  [facility] Pager [phone]. If 7PM-7AM, please contact night-coverage at [email or web address], password [facility] 08/18/2014, 6:31 PM  LOS: 3 days

## 2014-08-19 ENCOUNTER — Encounter (HOSPITAL_COMMUNITY): Payer: Self-pay | Admitting: Radiology

## 2014-08-19 ENCOUNTER — Inpatient Hospital Stay (HOSPITAL_COMMUNITY): Payer: Medicare Other

## 2014-08-19 DIAGNOSIS — I639 Cerebral infarction, unspecified: Secondary | ICD-10-CM

## 2014-08-19 DIAGNOSIS — Q644 Malformation of urachus: Secondary | ICD-10-CM

## 2014-08-19 DIAGNOSIS — S42009A Fracture of unspecified part of unspecified clavicle, initial encounter for closed fracture: Secondary | ICD-10-CM | POA: Diagnosis present

## 2014-08-19 LAB — CBC WITH DIFFERENTIAL/PLATELET
Basophils Absolute: 0 10*3/uL (ref 0.0–0.1)
Basophils Relative: 0 % (ref 0–1)
EOS PCT: 0 % (ref 0–5)
Eosinophils Absolute: 0 10*3/uL (ref 0.0–0.7)
HCT: 33.8 % — ABNORMAL LOW (ref 39.0–52.0)
Hemoglobin: 10.8 g/dL — ABNORMAL LOW (ref 13.0–17.0)
Lymphocytes Relative: 8 % — ABNORMAL LOW (ref 12–46)
Lymphs Abs: 1 10*3/uL (ref 0.7–4.0)
MCH: 30.6 pg (ref 26.0–34.0)
MCHC: 32 g/dL (ref 30.0–36.0)
MCV: 95.8 fL (ref 78.0–100.0)
MONOS PCT: 7 % (ref 3–12)
Monocytes Absolute: 0.9 10*3/uL (ref 0.1–1.0)
NEUTROS ABS: 10.2 10*3/uL — AB (ref 1.7–7.7)
Neutrophils Relative %: 85 % — ABNORMAL HIGH (ref 43–77)
Platelets: 326 10*3/uL (ref 150–400)
RBC: 3.53 MIL/uL — ABNORMAL LOW (ref 4.22–5.81)
RDW: 16.3 % — ABNORMAL HIGH (ref 11.5–15.5)
WBC: 12.1 10*3/uL — ABNORMAL HIGH (ref 4.0–10.5)

## 2014-08-19 LAB — LIPID PANEL
CHOLESTEROL: 125 mg/dL (ref 0–200)
HDL: 54 mg/dL (ref >39)
LDL Cholesterol: 59 mg/dL (ref 0–99)
Total CHOL/HDL Ratio: 2.3 RATIO
Triglycerides: 59 mg/dL (ref <150)
VLDL: 12 mg/dL (ref 0–40)

## 2014-08-19 LAB — HEPATIC FUNCTION PANEL
ALK PHOS: 194 U/L — AB (ref 39–117)
ALT: 172 U/L — ABNORMAL HIGH (ref 0–53)
AST: 238 U/L — ABNORMAL HIGH (ref 0–37)
Albumin: 2.1 g/dL — ABNORMAL LOW (ref 3.5–5.2)
BILIRUBIN INDIRECT: 0.8 mg/dL (ref 0.3–0.9)
BILIRUBIN TOTAL: 1.1 mg/dL (ref 0.3–1.2)
Bilirubin, Direct: 0.3 mg/dL (ref 0.0–0.5)
Total Protein: 5.7 g/dL — ABNORMAL LOW (ref 6.0–8.3)

## 2014-08-19 LAB — BASIC METABOLIC PANEL
ANION GAP: 6 (ref 5–15)
BUN: 25 mg/dL — ABNORMAL HIGH (ref 6–23)
CO2: 29 mmol/L (ref 19–32)
Calcium: 8.4 mg/dL (ref 8.4–10.5)
Chloride: 102 mmol/L (ref 96–112)
Creatinine, Ser: 0.75 mg/dL (ref 0.50–1.35)
GFR calc Af Amer: >90 mL/min (ref >90)
GFR calc non Af Amer: 79 mL/min — ABNORMAL LOW (ref >90)
Glucose, Bld: 134 mg/dL — ABNORMAL HIGH (ref 70–99)
POTASSIUM: 4.7 mmol/L (ref 3.5–5.1)
Sodium: 137 mmol/L (ref 135–145)

## 2014-08-19 LAB — CANCER ANTIGEN 19-9: CA 19 9: 15.5 U/mL — AB (ref <35.0)

## 2014-08-19 MED ORDER — PREDNISONE 20 MG PO TABS
20.0000 mg | ORAL_TABLET | Freq: Every day | ORAL | Status: DC
  Administered 2014-08-20 – 2014-08-21 (×2): 20 mg via ORAL
  Filled 2014-08-19 (×2): qty 1

## 2014-08-19 MED ORDER — IOHEXOL 300 MG/ML  SOLN
100.0000 mL | Freq: Once | INTRAMUSCULAR | Status: AC | PRN
  Administered 2014-08-19: 75 mL via INTRAVENOUS

## 2014-08-19 NOTE — Progress Notes (Signed)
[facility] PROGRESS NOTE  James Mcneil:[id number] DOB: 06/29/1923 DOA: 08/15/2014 PCP: Rene Paci, MD  Summary Appreciate Neurology. Spoke with Dr Amada Jupiter. James Mcneil is a pleasant 81 y.o. male with COPD/dementia/essential hypertension/hyperlipidemia/Old CVA which are pending 2001 resulting in residual right-sided hemiaplagia , among other medical problems, who presented after a fall associated with bladder and bowel incontinence as well as altered sensorium. His workup in the ED was unremarkable but he has since developed low-grade fever and respiratory symptoms suggesting acute exacerbation of COPD(white count 12,000 on the day of admission), which may have caused the fall. There was concern for broken ribs, and patient has right clavicular fracture. Repeat chest x-ray(post hydration) suggested chronic pulmonary fibrosis not mentioned on x-ray day of admission. I suspect these changes are due to aspiration pneumonitis. Patient is doing well on current antibiotics/systemic steroids/bronchodilators and oxygen supplementation as needed and incentive spirometer. Patient's family also mentioned that his right side is weaker prompting brain MRI which showed "Small acute infarction in the right parieto-occipital region without hemorrhage or mass effect. Old left MCA territory infarction". I could not get him to squeeze my arm with the right hand. He does not have demonstrable weakness on the left side. I wonder if the weakness on the right side is related to global issues. In any case, patient is on aspirin which will be continued. Family mentioned that he has a carotid stent placed in 2001. 2-D echocardiogram/carotid Doppler is pending. Appreciate speech therapy. Patient was noted to have persistently elevated LFTs despite holding statin(Zocor), prompting CT abdomen and pelvis which showed "1. Small bilateral pleural effusions with basilar atelectasis 2. Emphysematous changes in the  lungs with peripheral fibrosis and honeycomb changes. 3. Diffuse vascular calcifications. Small infrarenal abdominal aortic aneurysm measuring 2.7 cm diameter. 4. Asymmetric thickening of the bladder wall with tenting suggesting urachal remnant. 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 5. Diverticulosis of the sigmoid colon with mild wall thickening suggesting early diverticulitis. No abscess. 6. Diffuse bone demineralization and multiple vertebral compression fractures consistent with osteoporosis". Will keep holding Zocor and obtain MRCP to better evaluate abnormality gallbladder findings. Will also check CEA/CA-19-9 In light of the acute CVA, patient may still need short-term rehabilitation placement. Will continue antibiotics/systemic steroids with quick taper. Follow urology recommendations. Plan Fall/Encephalopathy due to infection/Late effects of cerebrovascular disease/Dementia/acute right occipitoparietal CVA.  Continue aspirin. Statin on hold in view of transaminitis.  Await carotid Doppler/2-D echo.  Speech therapy evaluation appreciated  Follow neurology recommendations. COPD exacerbation/Multiple rib fractures  Repeat chest x-ray shows "1. Mildly displaced distal RIGHT clavicle fracture. 2. No acute cardiopulmonary disease. Chronic pulmonary fibrosis"  Ceftriaxone/azithromycin/prednisone taper/albuterol/Atrovent/incentive spirometry/oxygen supplementation as needed.  Transaminitis/Urachal anomaly  MRCP  Check CEA/CA-19-9  Consider GI/surgical consultation depending on MRCP findings. Right Clavicle fracture  Supportive care DVT prophylaxis Code Status: Full code Family Communication: Family members at bedside-2 daughters Disposition Plan: Home with home health services versus short-term rehabilitation   Consultants:  Neurology  Procedures:  None  Antibiotics:  Ceftriaxone 08/16/14>  Azithromycin 08/16/2014>  HPI/Subjective: No  complaints.  Objective: Filed Vitals:   08/19/14 1420  BP: 119/56  Pulse: 93  Temp: 98.1 F (36.7 C)  Resp: 18    Intake/Output Summary (Last 24 hours) at 08/19/14 1455 Last data filed at 08/18/14 1716  Gross per 24 hour  Intake      0 ml  Output    225 ml  Net   -225 ml   Filed Weights   08/16/14  0020  Weight: 55.384 kg (122 lb 1.6 oz)    Exam:   General:  Lying comfortably in bed.  Cardiovascular: RRR. No murmurs. S1-S2 normal.  Respiratory: Occasional cough. Bibasilar rhonchi. No wheezing.  Abdomen: Soft, some mild tenderness to palpation  Musculoskeletal: No pedal edema.   Data Reviewed: Basic Metabolic Panel:  Recent Labs Lab 08/15/14 1905 08/16/14 0925 08/18/14 0645 08/19/14 0330  NA 138 140 139 137  K 3.9 4.0 3.9 4.7  CL 101 105 105 102  CO2 28 25 31 29   GLUCOSE 133* 129* 114* 134*  BUN 18 13 21  25*  CREATININE 1.00 0.83 0.86 0.75  CALCIUM 8.9 8.6 8.5 8.4   Liver Function Tests:  Recent Labs Lab 08/15/14 1905 08/16/14 0925 08/18/14 0645 08/19/14 0330  AST 73* 45* 266* 238*  ALT 41 29 155* 172*  ALKPHOS 200* 160* 193* 194*  BILITOT 0.8 1.1 0.4 1.1  PROT 8.1 6.5 6.0 5.7*  ALBUMIN 3.4* 2.7* 2.2* 2.1*   No results for input(s): LIPASE, AMYLASE in the last 168 hours. No results for input(s): AMMONIA in the last 168 hours. CBC:  Recent Labs Lab 08/15/14 1905 08/16/14 0925 08/18/14 0645 08/19/14 0330  WBC 12.3* 6.9 12.5* 12.1*  NEUTROABS 10.9*  --   --  10.2*  HGB 12.3* 11.5* 10.4* 10.8*  HCT 37.2* 35.5* 32.3* 33.8*  MCV 93.2 94.4 94.4 95.8  PLT 374 331 329 326   Cardiac Enzymes:  Recent Labs Lab 08/16/14 0132 08/16/14 0925 08/16/14 1555  TROPONINI 0.04* 0.03 0.03   BNP (last 3 results) No results for input(s): PROBNP in the last 8760 hours. CBG:  Recent Labs Lab 08/16/14 0655 08/18/14 0639  GLUCAP 97 111*    No results found for this or any previous visit (from the past 240 hour(s)).   Studies: Mr Brain Wo  Contrast  08/17/2014   CLINICAL DATA:  Dementia up. Right-sided weakness. Possible stroke.  EXAM: MRI HEAD WITHOUT CONTRAST  TECHNIQUE: Multiplanar, multiecho pulse sequences of the brain and surrounding structures were obtained without intravenous contrast.  COMPARISON:  Head CT 08/15/2014  FINDINGS: Diffusion imaging shows a small acute stroke in the right parieto-occipital region. No swelling, hemorrhage or mass effect. No other acute finding.  The brainstem is unremarkable. There is cerebellar atrophy without focal insult. The cerebral hemispheres elsewhere show old infarction in the left middle cerebral artery territory with atrophy, encephalomalacia and gliosis. No obstructive hydrocephalus. No extra-axial collection. No sign of mass lesion. No pituitary mass. Sinuses are clear. Small amount of fluid in the mastoid air cells bilaterally.  IMPRESSION: Small acute infarction in the right parieto-occipital region without hemorrhage or mass effect.  Old left MCA territory infarction.   Electronically Signed   By: Paulina FusiMark  Shogry M.D.   On: 08/17/2014 21:02   Ct Abdomen Pelvis W Contrast  08/19/2014   CLINICAL DATA:  Transaminitis.  EXAM: CT ABDOMEN AND PELVIS WITH CONTRAST  TECHNIQUE: Multidetector CT imaging of the abdomen and pelvis was performed using the standard protocol following bolus administration of intravenous contrast.  CONTRAST:  1 OMNIPAQUE IOHEXOL 300 MG/ML SOLN, 75mL OMNIPAQUE IOHEXOL 300 MG/ML SOLN  COMPARISON:  None.  FINDINGS: Small bilateral pleural effusions with basilar atelectasis. Peripheral fibrosis and honeycomb changes in the lungs suggesting usual interstitial pneumonitis. Emphysematous changes in the lung bases. Small pericardial effusion or thickening. Normal heart size. Coronary artery and aortic valve calcifications.  The liver, spleen, gallbladder, pancreas, adrenal glands, kidneys, inferior vena cava, and retroperitoneal lymph nodes  are unremarkable. Diffuse calcification of the  abdominal aorta and branch vessels. Infrarenal abdominal aortic aneurysm measuring 2.7 cm diameter, compared with 1.8 cm diameter proximally. Calcification renal artery, celiac axis, and superior mesenteric artery origins. Calcification of the iliac arteries. Stomach and small bowel are decompressed. Diffusely stool-filled colon without abnormal distention. No free air or free fluid in the abdomen.  Pelvis: There is diffuse thickening of the bladder wall with asymmetric thickening in the anterior dome. Tenting of the bladder. 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 Small adjacent bladder diverticulum is demonstrated. Prostate gland is not enlarged. No free or loculated pelvic fluid collections. Appendix is normal. Diverticulosis of the sigmoid colon. Mild wall thickening in the sigmoid colon may represent early diverticulitis. No abscess. Diffuse bone demineralization with compression fractures demonstrated at L5, L4, L3, L2, L1, and T11 levels. This is likely due to osteoporosis. Degenerative changes in the lumbar spine and hips.  IMPRESSION: 1. Small bilateral pleural effusions with basilar atelectasis 2. Emphysematous changes in the lungs with peripheral fibrosis and honeycomb changes. 3. Diffuse vascular calcifications. Small infrarenal abdominal aortic aneurysm measuring 2.7 cm diameter. 4. Asymmetric thickening of the bladder wall with tenting suggesting urachal remnant. 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 5. Diverticulosis of the sigmoid colon with mild wall thickening suggesting early diverticulitis. No abscess. 6. Diffuse bone demineralization and multiple vertebral compression fractures consistent with osteoporosis.   Electronically Signed   By: Burman Nieves M.D.   On: 08/19/2014 00:51    Scheduled Meds: . antiseptic oral rinse  7 mL Mouth Rinse BID  . aspirin EC  81 mg Oral Daily  . cefTRIAXone (ROCEPHIN)  IV  1 g Intravenous Q24H  .  donepezil  10 mg Oral QHS  . folic acid  1 mg Oral Daily  . furosemide  20 mg Oral Daily  . metoprolol tartrate  25 mg Oral BID  . mirtazapine  15 mg Oral QHS  . multivitamin with minerals  1 tablet Oral Daily  . pantoprazole  40 mg Oral Daily  . [START ON 08/20/2014] predniSONE  20 mg Oral Q breakfast  . thiamine  100 mg Oral Daily   Continuous Infusions: . sodium chloride 50 mL/hr at 08/18/14 1529    Active Problems:   Fall   Encephalopathy due to infection   COPD exacerbation   Late effects of cerebrovascular disease   Dementia   Multiple rib fractures   Acute CVA (cerebrovascular accident)   Transaminitis   Urachal anomaly    Time spent: 20 minutes.    Aurianna Earlywine  [facility] Pager [phone]. If 7PM-7AM, please contact night-coverage at [email or web address], password [facility] 08/19/2014, 2:55 PM  LOS: 4 days

## 2014-08-19 NOTE — Consult Note (Addendum)
Neurology Consultation Reason for Consult: Incidental stroke Referring Physician: Ranga, S  CC: Incidental stroke  History is obtained from: Patient, family  HPI: James Mcneil is a 81 y.o. male with a history of previous stroke in 2002 with right-sided hemiparesis and aphasia who presents with worsening of his right-sided weakness and aphasia. He was found also be having a COPD exacerbation/bronchitis. And is currently on treatment with antibiotics. An MRI was obtained given his worsening previous right-sided weakness and there is a small punctate cortical infarct that I feel is incidental in the right parietal region. Though in a position that could be prone artifact, it is on multiple cuts.   LKW: Unknown tpa given?: no, unclear time of onset    ROS:  Unable to obtain due to altered mental status.   Past Medical History  Diagnosis Date  . APHASIA, LE CEREBROVASCULAR DISEASE 2002    expressive  . HYPERGLYCEMIA   . HYPERLIPIDEMIA NEC/NOS   . HYPERTENSION   . Stroke 2002    residual R>LUE and mild exp aphasia  . COPD     Family History: Unable to obtain due to altered mental status.  Social History: Tob: Unable to obtain due to altered mental status.  Exam: Current vital signs: BP 119/56 mmHg  Pulse 93  Temp(Src) 98.1 F (36.7 C) (Oral)  Resp 18  Ht 5\' 4"  (1.626 m)  Wt 55.384 kg (122 lb 1.6 oz)  SpO2 90% Vital signs in last 24 hours: Temp:  [97.7 F (36.5 C)-98.8 F (37.1 C)] 98.1 F (36.7 C) (01/31 1420) Pulse Rate:  [80-94] 93 (01/31 1420) Resp:  [18-22] 18 (01/31 1420) BP: (108-141)/(50-68) 119/56 mmHg (01/31 1420) SpO2:  [90 %-99 %] 90 % (01/31 1420)  Physical Exam  Constitutional: Appears elderly and frail Psych: Affect appropriate to situation Eyes: No scleral injection HENT: No OP obstrucion Head: Normocephalic.  Cardiovascular: Normal rate and regular rhythm.  Respiratory: Effort normal  GI: Soft.  No distension. There is no tenderness.   Skin: WDI  Neuro: Mental Status: Patient has a significant expressive aphasia, but is able to follow commands readily. Cranial Nerves: II: Visual Fields are full to blink to threat. Pupils are equal, round, and reactive to light.   III,IV, VI: Tracks across midline both directions .  V: Facial sensation is decreased on the right VII: Facial movement is notable for right facial weakness VIII: hearing is intact to voice X: Uvula elevates symmetrically XI: Shoulder shrug is symmetric. XII: tongue is midline without atrophy or fasciculations.  Motor: Tone is normal. Bulk is normal. 5/5 strength was present on the left, he has a significant right hemiparesis affecting the arm more than leg Sensory: Sensation is decreased on the right Cerebellar: No clear ataxia         I have reviewed labs in epic and the results pertinent to this consultation are: Leukocytosis  I have reviewed the images obtained: MRI brain tiny diffusion positivity in the right parietal region  Impression: [age] year old male with incidental small infarct. He does have a history of stroke, and we'll reassess his risk factors to ensure that we're doing what we can to lower his risk of recurrent stroke. The symptoms that he is having are due to recrudescence of previous stroke symptoms in the setting of acute infection.  Recommendations: 1. HgbA1c, fasting lipid panel 2. Frequent neuro checks 3. Echocardiogram 4. Carotid dopplers 5. Prophylactic therapy-Antiplatelet med: Aspirin - dose 325mg  PO or 300mg  PR 6. Risk factor modification  7. Telemetry monitoring 8. PT consult, OT consult, Speech consult   Ritta Slot, MD [facility] [phone]  If 7pm- 7am, please page neurology on call as listed in AMION.

## 2014-08-19 NOTE — Evaluation (Signed)
Clinical/Bedside Swallow Evaluation Patient Details  Name: James Mcneil R Ranker MRN: [id number] Date of Birth: 07/08/1923  Today's Date: 08/19/2014 Time: SLP Start Time (ACUTE ONLY): 1058 SLP Stop Time (ACUTE ONLY): 1123 SLP Time Calculation (min) (ACUTE ONLY): 25 min  Past Medical History:  Past Medical History  Diagnosis Date  . APHASIA, LE CEREBROVASCULAR DISEASE 2002    expressive  . HYPERGLYCEMIA   . HYPERLIPIDEMIA NEC/NOS   . HYPERTENSION   . Stroke 2002    residual R>LUE and mild exp aphasia  . COPD    Past Surgical History:  Past Surgical History  Procedure Laterality Date  . No past surgeries     HPI:  81 y.o. male with hx of dementia, left CVA 2001 with chronic expressive aphasia, cared for at home by family, admitted 08/15/14 s/p slip (incontinent of bowels) and fall in bathroom; +LOC x 5minutes; ?Lt 7-8 anterior rib fxs; Rt clavicle fx; bil elbow skin tears, COPD exacerbation, concerns for aspiration pneumonitis.  MRI reveals small acute infarction in the right parieto-occipital region without hemorrhage or mass effect. Old left MCA territory infarction. Despite aphasia, pt is able to make basic needs known with stereotypies/short utterances that he uses to convey needs.  Family understands his communication fairly well.    Assessment / Plan / Recommendation Clinical Impression  Pt's swallow appears functional during today's assessment, with no overt s/s of aspiration, good attention to POs, adequate mastication.  However, per daughter, pt has been coughing intermittently with liquids since admission (particularly with straw use). There are concerns that pt's acute CVA, COPD exacerbation, rib pain limiting upright positioning during meals - all in the context of a remote dysphagia - may have caused decompensation in swallow function with increased aspiration risk.  For now, recommend continuing current diet - regular consistency, thin liquids.  Promote upright positioning when  taking POs; avoid straws.  SLP will follow for safety and to determine if instrumental swallow study is warranted.  Family agrees with plan.     Aspiration Risk  Mild    Diet Recommendation Regular;Thin liquid   Liquid Administration via: Cup;No straw Medication Administration: Whole meds with liquid Supervision: Patient able to self feed Compensations: Slow rate;Small sips/bites Postural Changes and/or Swallow Maneuvers: Seated upright 90 degrees    Other  Recommendations Oral Care Recommendations: Oral care BID   Follow Up Recommendations   (tba)    Frequency and Duration min 2x/week  2 weeks     Swallow Study Prior Functional Status       General Date of Onset: 08/15/14 HPI: 81 y.o. male with hx of dementia, left CVA 2001 with chronic expressive aphasia, cared for at home by family, admitted 08/15/14 s/p slip (incontinent of bowels) and fall in bathroom; +LOC x 5minutes; ?Lt 7-8 anterior rib fxs; Rt clavicle fx; bil elbow skin tears, COPD exacerbation, concerns for aspiration pneumonitis.  MRI reveals small acute infarction in the right parieto-occipital region without hemorrhage or mass effect. Old left MCA territory infarction.  Type of Study: Bedside swallow evaluation Previous Swallow Assessment: none per records Diet Prior to this Study: Regular;Thin liquids Temperature Spikes Noted: No Respiratory Status: Nasal cannula History of Recent Intubation: No Behavior/Cognition: Alert;Cooperative Oral Cavity - Dentition: Dentures, top;Dentures, bottom Self-Feeding Abilities: Able to feed self Patient Positioning: Partially reclined (due to rib pain) Baseline Vocal Quality: Clear Volitional Cough: Cognitively unable to elicit Volitional Swallow: Unable to elicit    Oral/Motor/Sensory Function Overall Oral Motor/Sensory Function:  (tongue deviation right)  Ice Chips Ice chips: Not tested   Thin Liquid Thin Liquid: Impaired Presentation: Cup Oral Phase Impairments: Reduced  labial seal Pharyngeal  Phase Impairments: Multiple swallows    Nectar Thick Nectar Thick Liquid: Not tested   Honey Thick Honey Thick Liquid: Not tested   Puree Puree: Within functional limits   Solid   GO    Solid: Within functional limits      Shyan Scalisi L. Samson Frederic, MA CCC/SLP Pager [phone]  Blenda Mounts Laurice 08/19/2014,11:40 AM

## 2014-08-20 ENCOUNTER — Inpatient Hospital Stay (HOSPITAL_COMMUNITY): Payer: Medicare Other

## 2014-08-20 ENCOUNTER — Encounter (HOSPITAL_COMMUNITY): Payer: Self-pay | Admitting: *Deleted

## 2014-08-20 DIAGNOSIS — S060X9A Concussion with loss of consciousness of unspecified duration, initial encounter: Secondary | ICD-10-CM | POA: Diagnosis present

## 2014-08-20 DIAGNOSIS — I6789 Other cerebrovascular disease: Secondary | ICD-10-CM

## 2014-08-20 DIAGNOSIS — S060X1A Concussion with loss of consciousness of 30 minutes or less, initial encounter: Secondary | ICD-10-CM

## 2014-08-20 DIAGNOSIS — S060XAA Concussion with loss of consciousness status unknown, initial encounter: Secondary | ICD-10-CM | POA: Diagnosis present

## 2014-08-20 DIAGNOSIS — I609 Nontraumatic subarachnoid hemorrhage, unspecified: Secondary | ICD-10-CM | POA: Diagnosis present

## 2014-08-20 LAB — CBC
HCT: 35.8 % — ABNORMAL LOW (ref 39.0–52.0)
HEMOGLOBIN: 11.5 g/dL — AB (ref 13.0–17.0)
MCH: 30.2 pg (ref 26.0–34.0)
MCHC: 32.1 g/dL (ref 30.0–36.0)
MCV: 94 fL (ref 78.0–100.0)
Platelets: 352 10*3/uL (ref 150–400)
RBC: 3.81 MIL/uL — ABNORMAL LOW (ref 4.22–5.81)
RDW: 16.3 % — ABNORMAL HIGH (ref 11.5–15.5)
WBC: 9.5 10*3/uL (ref 4.0–10.5)

## 2014-08-20 LAB — GLUCOSE, CAPILLARY: GLUCOSE-CAPILLARY: 89 mg/dL (ref 70–99)

## 2014-08-20 LAB — COMPREHENSIVE METABOLIC PANEL
ALBUMIN: 2.3 g/dL — AB (ref 3.5–5.2)
ALK PHOS: 231 U/L — AB (ref 39–117)
ALT: 248 U/L — AB (ref 0–53)
ANION GAP: 6 (ref 5–15)
AST: 263 U/L — ABNORMAL HIGH (ref 0–37)
BILIRUBIN TOTAL: 0.3 mg/dL (ref 0.3–1.2)
BUN: 21 mg/dL (ref 6–23)
CO2: 30 mmol/L (ref 19–32)
Calcium: 8.7 mg/dL (ref 8.4–10.5)
Chloride: 102 mmol/L (ref 96–112)
Creatinine, Ser: 0.71 mg/dL (ref 0.50–1.35)
GFR calc non Af Amer: 81 mL/min — ABNORMAL LOW (ref >90)
Glucose, Bld: 97 mg/dL (ref 70–99)
Potassium: 3.9 mmol/L (ref 3.5–5.1)
Sodium: 138 mmol/L (ref 135–145)
TOTAL PROTEIN: 6 g/dL (ref 6.0–8.3)

## 2014-08-20 LAB — HEMOGLOBIN A1C
Hgb A1c MFr Bld: 6.2 % — ABNORMAL HIGH (ref 4.8–5.6)
Mean Plasma Glucose: 131 mg/dL

## 2014-08-20 LAB — CEA: CEA: 3.9 ng/mL (ref 0.0–4.7)

## 2014-08-20 MED ORDER — IOHEXOL 350 MG/ML SOLN
50.0000 mL | Freq: Once | INTRAVENOUS | Status: AC | PRN
  Administered 2014-08-20: 50 mL via INTRAVENOUS

## 2014-08-20 NOTE — Progress Notes (Signed)
Echocardiogram 2D Echocardiogram has been performed.  James Mcneil, James Mcneil 08/20/2014, 1:54 PM

## 2014-08-20 NOTE — Progress Notes (Signed)
Physical Therapy Treatment Patient Details Name: James Mcneil MRN: [id number] DOB: 05/14/1923 Today's Date: 08/20/2014    History of Present Illness Adm 08/15/14 s/p slip (incontinent of bowels) and fall in bathroom; +LOC x 5minutes; ?Lt 7-8 anterior rib fxs; Rt clavicle fx; bil elbow skin tears PMHx- L CVA '01 with expressive aphasia, Rt sided weakness; dementia; COPD    PT Comments    Pt. Was hesitant to get OOB but with two people he was able to do it safely. Was hard to hear and in pain due to fx. Pt. Would benefit from more bed mobility to increase his independence and also some gait and stair training next session in order to decrease the dependence on family upon d/c to home health.   Follow Up Recommendations  Home health PT;Supervision/Assistance - 24 hour     Equipment Recommendations  None recommended by PT    Recommendations for Other Services       Precautions / Restrictions Precautions Precautions: Fall Restrictions Weight Bearing Restrictions: No    Mobility  Bed Mobility Overal bed mobility: Needs Assistance Bed Mobility: Supine to Sit     Supine to sit: Mod assist     General bed mobility comments: mod assist to raise torso able to do his legs independently  Transfers Overall transfer level: Needs assistance Equipment used: 2 person hand held assist Transfers: Stand Pivot Transfers   Stand pivot transfers: Min assist;+2 physical assistance       General transfer comment: was able shuffle feet toward chair and pivot with tactile and verbal cues  Ambulation/Gait                 Stairs            Wheelchair Mobility    Modified Rankin (Stroke Patients Only)       Balance                                    Cognition Arousal/Alertness: Lethargic Behavior During Therapy: Anxious Overall Cognitive Status: History of cognitive impairments - at baseline                      Exercises      General  Comments        Pertinent Vitals/Pain Pain Assessment: Faces Faces Pain Scale: Hurts even more Pain Location: R shoulder and ribs  Pain Descriptors / Indicators: Grimacing Pain Intervention(s): Monitored during session;Limited activity within patient's tolerance;Repositioned    Home Living                      Prior Function            PT Goals (current goals can now be found in the care plan section) Progress towards PT goals: Progressing toward goals    Frequency       PT Plan Current plan remains appropriate    Co-evaluation             End of Session   Activity Tolerance: Patient limited by lethargy Patient left: in chair;with chair alarm set;with family/visitor present     Time: 1130-1140 PT Time Calculation (min) (ACUTE ONLY): 10 min  Charges:                       G Codes:      Perlie MayoWilson, Evonna Stoltz, SPTA 08/20/2014, 12:40 PM

## 2014-08-20 NOTE — Progress Notes (Signed)
SLP Cancellation Note  Patient Details Name: Mylo RedWilliam R Pauling MRN: [id number] DOB: 01/19/1923   Cancelled treatment:       Reason Eval/Treat Not Completed: Patient at procedure or test/unavailable    Maxcine HamLaura Paiewonsky, M.A. CCC-SLP [phone]  Maxcine Hamaiewonsky, Cybill Uriegas 08/20/2014, 2:14 PM

## 2014-08-20 NOTE — Progress Notes (Signed)
STROKE TEAM PROGRESS NOTE   HISTORY James Mcneil is a 81 y.o. male with a history of previous stroke in 2002 with right-sided hemiparesis and aphasia who presents with worsening of his right-sided weakness and aphasia. He was found also be having a COPD exacerbation/bronchitis and was admitted 08/15/2014 And is currently on treatment with antibiotics. An MRI was obtained 08/17/2014 given his worsening previous right-sided weakness and there is a small punctate cortical infarct that  is felt to be incidental in the right parietal region. Though in a position that could be prone artifact, it is on multiple cuts. His LKW is unknown. Neuro was consulted 08/19/2014. Patient was not administered TPA secondary to unknown time last known well, stroke not recognized.    SUBJECTIVE (INTERVAL HISTORY) His granddaughter is at the bedside - she reports his main neuro symptom was worsening slurred speech, and became intelligible. However his weakness on the right side seems at his baseline.  Overall he feels his condition is stable. Granddaughter does not think wife will want to be aggressive with care.   Received phone call from radiologist Dr. Margo Aye and I was informed that his right parietal occipital stroke reported before actually was subarachnoid hemorrhage due to traumatic brain injury.  OBJECTIVE Temp:  [97.6 F (36.4 C)-98.2 F (36.8 C)] 97.7 F (36.5 C) (02/01 0533) Pulse Rate:  [86-105] 86 (02/01 0533) Cardiac Rhythm:  [-]  Resp:  [18-20] 20 (02/01 0533) BP: (119-157)/(50-63) 157/63 mmHg (02/01 0533) SpO2:  [90 %-99 %] 99 % (02/01 0533)   Recent Labs Lab 08/16/14 0655 08/18/14 0639 08/20/14 0657  GLUCAP 97 111* 89    Recent Labs Lab 08/15/14 1905 08/16/14 0925 08/18/14 0645 08/19/14 0330 08/20/14 0737  NA 138 140 139 137 138  K 3.9 4.0 3.9 4.7 3.9  CL 101 105 105 102 102  CO2 GLUCOSE 133* 129* 114* 134* 97  BUN 25* 21  CREATININE 1.00 0.83 0.86 0.75  0.71  CALCIUM 8.9 8.6 8.5 8.4 8.7    Recent Labs Lab 08/15/14 1905 08/16/14 0925 08/18/14 0645 08/19/14 0330 08/20/14 0737  AST 73* 45* 266* 238* 263*  ALT 41 29 155* 172* 248*  ALKPHOS 200* 160* 193* 194* 231*  BILITOT 0.8 1.1 0.4 1.1 0.3  PROT 8.1 6.5 6.0 5.7* 6.0  ALBUMIN 3.4* 2.7* 2.2* 2.1* 2.3*    Recent Labs Lab 08/15/14 1905 08/16/14 0925 08/18/14 0645 08/19/14 0330 08/20/14 0737  WBC 12.3* 6.9 12.5* 12.1* 9.5  NEUTROABS 10.9*  --   --  10.2*  --   HGB 12.3* 11.5* 10.4* 10.8* 11.5*  HCT 37.2* 35.5* 32.3* 33.8* 35.8*  MCV 93.2 94.4 94.4 95.8 94.0  PLT 374 331 329 326 352    Recent Labs Lab 08/16/14 0132 08/16/14 0925 08/16/14 1555  TROPONINI 0.04* 0.03 0.03   No results for input(s): LABPROT, INR in the last 72 hours. No results for input(s): COLORURINE, LABSPEC, PHURINE, GLUCOSEU, HGBUR, BILIRUBINUR, KETONESUR, PROTEINUR, UROBILINOGEN, NITRITE, LEUKOCYTESUR in the last 72 hours.  Invalid input(s): APPERANCEUR     Component Value Date/Time   CHOL 125 08/19/2014 0330   TRIG 59 08/19/2014 0330   HDL 54 08/19/2014 0330   CHOLHDL 2.3 08/19/2014 0330   VLDL 12 08/19/2014 0330   LDLCALC 59 08/19/2014 0330   Lab Results  Component Value Date   HGBA1C 6.2* 08/19/2014   No results found for: LABOPIA, COCAINSCRNUR, LABBENZ, AMPHETMU, THCU, LABBARB  No results for  input(s): ETH in the last 168 hours.  I have personally reviewed the radiological images below and agree with the radiology interpretations.  Dg Pelvis 1-2 Views 08/15/2014    No acute osseous injury of the pelvis.     Ct Cervical Spine Wo Contrast 08/15/2014    1. No acute intracranial pathology. 2. No acute osseous injury of the cervical spine. 3. 10 mm spiculated subpleural right upper lobe pulmonary nodule which is more prominent compared with CT chest dated 09/09/2011. A dedicated non emergent CT of the chest is recommended for further evaluation. This may reflect scarring versus malignancy.    Mr Abdomen Wo Contrast 08/20/2014    1. Exceedingly limited secondary to patient's request to terminate the examination prior to completion. Despite the limitations of this examination, there is no intra or extrahepatic biliary ductal dilatation to suggest biliary tract obstruction at this time. 2. T2 hypointensity throughout the liver and spleen may suggest iron deposition. 3. Additional findings, as above.    Ct Abdomen Pelvis W Contrast 08/19/2014    1. Small bilateral pleural effusions with basilar atelectasis 2. Emphysematous changes in the lungs with peripheral fibrosis and honeycomb changes. 3. Diffuse vascular calcifications. Small infrarenal abdominal aortic aneurysm measuring 2.7 cm diameter. 4. Asymmetric thickening of the bladder wall with tenting suggesting urachal remnant. 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 5. Diverticulosis of the sigmoid colon with mild wall thickening suggesting early diverticulitis. No abscess. 6. Diffuse bone demineralization and multiple vertebral compression fractures consistent with osteoporosis.     Chest Port 1 View 08/16/2014    1. Mildly displaced distal RIGHT clavicle fracture. 2. No acute cardiopulmonary disease.  Chronic pulmonary fibrosis.    08/15/2014    No active cardiopulmonary disease.  Possible left anterior lateral seventh and eighth rib fractures.    Ct Head Wo Contrast 08/15/2014   1. No acute intracranial pathology. 2. No acute osseous injury of the cervical spine. 3. 10 mm spiculated subpleural right upper lobe pulmonary nodule which is more prominent compared with CT chest dated 09/09/2011. A dedicated non emergent CT of the chest is recommended for further evaluation. This may reflect scarring versus malignancy.   MRI Brain 08/17/2014 Small acute infarction in the right parieto-occipital region without hemorrhage or mass effect. Received addendum that the reported infarction at the right parietal occipital region was actually  subarachnoid hemorrhage due to Medical brain injury.  CTA head and neck 08/20/14 1. Small volume subarachnoid hemorrhage over the posterior right convexity, I believe it was these blood products resulting in the diffusion abnormality on the recent brain MRI, rather than small acute infarcts. This is likely this sequelae of recent fall, and has not significantly changed since 08/15/2014. 2. Moderate to severe T5 compression fracture appears to be acute. If specific therapy such as vertebroplasty is desired, thoracic MRI or whole-body bone scan would best confirm acuity. 3. Left carotid in the neck was stented in 2002. There is high-grade in stent re- stenosis, numerically estimated at 80%. The left carotid system remains patent, with occasional left CCA ulcerated plaque. 4. High-grade right ICA stenosis related to soft and calcified plaque, approaching a RADIOGRAPHIC STRING SIGN. 5. High-grade right vertebral artery origin stenosis (STRING SIGN). The right vertebral is dominant. Moderate to severe left vertebral artery origin stenosis. 6. Moderate intracranial supra clinoid ICA siphon stenosis due to calcified plaque. Otherwise negative intracranial CTA. 7. Chronic left MCA infarct.  2D echo - Left ventricle: The cavity size was normal. There was mild concentric  hypertrophy. Systolic function was normal. The estimated ejection fraction was in the range of 60% to 65%. Wall motion was normal; there were no regional wall motion abnormalities. Doppler parameters are consistent with abnormal left ventricular relaxation (grade 1 diastolic dysfunction). - Aortic valve: There was moderate stenosis. There was mild regurgitation. Valve area (VTI): 1.77 cm^2. Valve area (Vmax): 1.67 cm^2. Valve area (Vmean): 1.71 cm^2. - Atrial septum: No defect or patent foramen ovale was identified. - Pulmonary arteries: Systolic pressure was mildly increased. PA peak pressure: 37 mm Hg  (S).  PHYSICAL EXAM  Temp:  [97.6 F (36.4 C)-98.9 F (37.2 C)] 98.9 F (37.2 C) (02/01 1020) Pulse Rate:  [86-105] 99 (02/01 1020) Resp:  [20] 20 (02/01 1020) BP: (114-157)/(50-63) 114/57 mmHg (02/01 1020) SpO2:  [95 %-99 %] 98 % (02/01 1020)  General - Well nourished, well developed, in no apparent distress, mild agitation on exam.  Ophthalmologic - Incorporation on exam.  Cardiovascular - Regular rate and rhythm with no murmur.  Neuro - awake alert, orientated to self and place, not oriented to age, people, time, present. Severe dysarthria, most of time intelligible. Impaired naming, not able to repeat, follow some simple commands, not complex commands. Blinking to visual threat bilaterally, right facial droop, PERRL, EOMI, right upper extremity 3/5, right lower extremity 5-/5, left upper and lower extremity 5/5. Reflex 1+, no Babinski. Coordination not incorporative, gait not tested.   ASSESSMENT/PLAN James Mcneil is a 81 y.o. male with history of of previous stroke in 2002 with right-sided hemiparesis and dysarthria/aphasia who presents with worsening of his right-sided weakness and aphasia.  He did not receive IV t-PA due to unknown time last known well, stroke not recognized. On today's exam, right-sided weakness since at baseline, however severe dysarthria almost intelligible. Initial CT of head did not show right parietal occipital subretinal hemorrhage, MRI concerning for small infarct in that region, however repeat CTA confirmed subretinal hemorrhage due to fall hitting head instead of stroke.   Right parieto-occipital small SAH - due to fall and hit head  Confirmed on CTA comparing with previous MRI and CT  No enlargement of hemorrhage  Neurological stable  Supportive care  Continue aspirin 81  Chronic left MCA stroke - presenting symptoms of worsening R HP and aphasia felt to be recrudesce of previous L brain infarct in setting of COPD  exacerbation.  Resultant right hemiparesis seems at baseline. dysarthria worsened  MRI - No acute stroke  CT angio head & neck R ICA high-grade stenosis with string sign, left ICA status post stent with restenosis 80%.  Asymptomatic as well as dementia with multiple medical conditions, therefore medical management, no neurosurgical intervention needed.   2D Echo EF 60-65%  Diet heart healthy/carb modified thin liquids  aspirin 81 mg orally every day prior to admission, now on aspirin 81 mg orally every day  Ongoing aggressive stroke risk factor management  Therapy recommendations:  HH PT  Disposition:  Return home with family, HH  Hypertension  Home meds Lasix, metoprolol  Resumed on admission  Stable BP   Hyperlipidemia  Home meds:  zocor 20 mg daily resumed in hospital  LDL 59, goal < 70  Continue statin at discharge  Other Stroke Risk Factors  Advanced age  Former Cigarette smoker  Hx L MCA stroke, s/p L ICA stent - RUE hemiparesis and expressive aphasia  Other Active Problems  COPD exacerbation  Pulmonary fibrosis  Baseline dementia, functional with most ADLs per family  "spot  on Gallbladder" per family. Awaiting confirmatory testing  Hospital day # 5  [facility] See Amion for Pager information 08/20/2014 10:46 AM   I, the attending vascular neurologist, have personally obtained a history, examined the patient, evaluated laboratory data, individually viewed imaging studies and agree with radiology interpretations. I also obtained additional history from pt's wife and daughter at bedside. I also discussed with Dr. Wyonia Hough regarding his care plan. Together with the NP/PA, we formulated the assessment and plan of care which reflects our mutual decision.  I have made any additions or clarifications directly to the above note and agree with the findings and plan as currently documented.   81 year old male with history of  hypertension and hyperlipidemia, CVA in 2002 with right-sided weakness and dysarthria/aphasia was admitted after fall hitting head and worsening dysarthria/aphasia and right-sided weakness. Imaging showed right parietal occipital subretinal hemorrhage due to fall, chronic left MCA stroke. CTA also showed bilateral ICA high-grade stenosis. However, due to his baseline dementia, multiple complex medical condition, asymptomatic bilateral ICA stenosis, medical management is recommended, no surgical intervention needed. Continue aspirin 81, and the statin for stroke prevention. Risk factor modification.  Neurology will sign off. Please call with questions. No neurology follow-up needed. Thanks for the consult.  Marvel Plan, MD PhD Stroke Neurology 08/20/2014 6:37 PM   To contact Stroke Continuity provider, please refer to [email or web address]. After hours, contact General Neurology

## 2014-08-20 NOTE — Care Management Note (Signed)
    Page 1 of 1   08/21/2014     3:31:15 PM CARE MANAGEMENT NOTE 08/21/2014  Patient:  James Mcneil, James Mcneil   Account Number:  [id number]  Date Initiated:  08/20/2014  Documentation initiated by:  Lorne Skeens  Subjective/Objective Assessment:   patient was admitted status post fall, CVA, COPD exacerbation.  Lives at home with children.     Action/Plan:   Will follow for discharge needs pending PT/OT evals and physician orders.   Anticipated DC Date:  08/21/2014   Anticipated DC Plan:  [place]  CM consult      Choice offered to / List presented to:  C-4 Adult Children        HH arranged  HH-1 RN  [place]      [place] agency  [facility]   Status of service:  Completed, signed off Medicare Important Message given?  YES (If response is "NO", the following Medicare IM given date fields will be blank) Date Medicare IM given:  08/20/2014 Medicare IM given by:  Lorne Skeens Date Additional Medicare IM given:   Additional Medicare IM given by:    Discharge Disposition:  [place]  Per UR Regulation:  Reviewed for med. necessity/level of care/duration of stay  If discussed at [place] of Stay Meetings, dates discussed:    Comments:  08/21/14 [place], MSN, CM- Met with patient and family to discuss home health needs.  Family has requested services through [facility].  Mary with [facility] was notified and has accepted the referral for discharge home today.   08/20/14 [place], MSN, CM- Medicare IM letter provided.

## 2014-08-20 NOTE — Progress Notes (Signed)
UR complete.  Reyden Smith RN, MSN 

## 2014-08-20 NOTE — Progress Notes (Signed)
[facility] PROGRESS NOTE  James Mcneil VHQ:[id number] DOB: 1923-06-08 DOA: 08/15/2014 PCP: Rene Paci, MD  Summary Appreciate Neurology. Spoke with Dr Roda Shutters. James Mcneil is a pleasant 81 y.o. male with COPD/dementia/essential hypertension/hyperlipidemia/Old CVA which are pending 2001 resulting in residual right-sided hemiaplagia , among other medical problems, who presented after a fall associated with bladder and bowel incontinence as well as altered sensorium. His workup in the ED was unremarkable but he has since developed low-grade fever and respiratory symptoms suggesting acute exacerbation of COPD(white count 12,000 on the day of admission), which may have caused the fall. There was concern for broken ribs, and patient has right clavicular fracture. Repeat chest x-ray(post hydration) suggested chronic pulmonary fibrosis not mentioned on x-ray day of admission. I suspect these changes are due to aspiration pneumonitis. Patient is doing well on current antibiotics/systemic steroids/bronchodilators and oxygen supplementation as needed and incentive spirometer. Patient's family also mentioned that his right side is weaker prompting brain MRI which showed "Small acute infarction in the right parieto-occipital region without hemorrhage or mass effect. Old left MCA territory infarction". CTA head/neck then showed "1. Small volume subarachnoid hemorrhage over the posterior right convexity, I believe it was these blood products resulting in the diffusion abnormality on the recent brain MRI, rather than small acute infarcts. This is likely this sequelae of recent fall, and has not significantly changed since 08/15/2014. 2. Moderate to severe T5 compression fracture appears to be acute. If specific therapy such as vertebroplasty is desired, thoracic MRI or whole-body bone scan would best confirm acuity. 3. Left carotid in the neck was stented in 2002. There is high-grade in stent re- stenosis,  numerically estimated at 80%. The left carotid system remains patent, with occasional left CCA ulcerated plaque. 4. High-grade right ICA stenosis related to soft and calcified plaque, approaching a RADIOGRAPHIC STRING SIGN. 5. High-grade right vertebral artery origin stenosis (STRING SIGN). The right vertebral is dominant. Moderate to severe left vertebral artery origin stenosis. 6. Moderate intracranial supra clinoid ICA siphon stenosis due to calcified plaque. Otherwise negative intracranial CTA. 7. Chronic left MCA infarct".  2-D echocardiogram showed normal EF with moderate aortic stenosis.  Appreciate speech therapy. Patient was noted to have persistently elevated LFTs despite holding statin(Zocor), prompting CT abdomen and pelvis which showed "1. Small bilateral pleural effusions with basilar atelectasis 2. Emphysematous changes in the lungs with peripheral fibrosis and honeycomb changes. 3. Diffuse vascular calcifications. Small infrarenal abdominal aortic aneurysm measuring 2.7 cm diameter. 4. Asymmetric thickening of the bladder wall with tenting suggesting urachal remnant. 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 5. Diverticulosis of the sigmoid colon with mild wall thickening suggesting early diverticulitis. No abscess. 6. Diffuse bone demineralization and multiple vertebral compression fractures consistent with osteoporosis". MRCP showed "1. Exceedingly limited secondary to patient's request to terminate the examination prior to completion. Despite the limitations of this examination, there is no intra or extrahepatic biliary ductal dilatation to suggest biliary tract obstruction at this time. 2. T2 hypointensity throughout the liver and spleen may suggest iron deposition".  Patient is clinically better with improvement in white count and appetite. At this point he neurology didn't recommend further intervention for the subarachnoid bleed other than continuing low-dose aspirin and physical therapy.  Will Follow Neurology recommendations. Continue current antibiotics and steroid taper. Plan Fall/Encephalopathy due to infection/Late effects of cerebrovascular disease/Dementia/acute right occipitoparietal CVA.  Continue aspirin. Statin on hold in view of transaminitis.  Speech therapy evaluation appreciated  Follow neurology recommendations.  Physical therapy  COPD exacerbation/Multiple rib fractures  Repeat chest x-ray shows "1. Mildly displaced distal RIGHT clavicle fracture. 2. No acute cardiopulmonary disease. Chronic pulmonary fibrosis"  Ceftriaxone/prednisone taper/albuterol/Atrovent/incentive spirometry/oxygen supplementation as needed. Transaminitis/Urachal anomaly  MRCP reviewed  CEA/CA-19-9 normal  If enzymes are in a.m. consult GI Right Clavicle fracture  Supportive care DVT prophylaxis Code Status: Full code Family Communication: Family members at bedside-2 daughters Disposition Plan: Home with home health services versus short-term rehabilitation   Consultants:  Neurology  Procedures:  None  Antibiotics:  Ceftriaxone 08/16/14>  Azithromycin 08/16/2014> 08/19/2014   HPI/Subjective: Denies any complaints.  Objective: Filed Vitals:   08/20/14 1020  BP: 114/57  Pulse: 99  Temp: 98.9 F (37.2 C)  Resp: 20    Intake/Output Summary (Last 24 hours) at 08/20/14 1814 Last data filed at 08/20/14 0159  Gross per 24 hour  Intake      0 ml  Output   1125 ml  Net  -1125 ml   Filed Weights   08/16/14 0020  Weight: 55.384 kg (122 lb 1.6 oz)    Exam:   General:  Seems more with it. He is eating.  Cardiovascular: S1-S2 normal. no murmurs  Respiratory: Good air entry bilaterally. No wheezing.  Abdomen: Soft and nontender. Normal bowel sounds.  Musculoskeletal: No pedal edema.   Data Reviewed: Basic Metabolic Panel:  Recent Labs Lab 08/15/14 1905 08/16/14 0925 08/18/14 0645 08/19/14 0330 08/20/14 0737  NA 138 140 139 137 138  K  3.9 4.0 3.9 4.7 3.9  CL 101 105 105 102 102  CO2 28 25 31 29 30   GLUCOSE 133* 129* 114* 134* 97  BUN 18 13 21  25* 21  CREATININE 1.00 0.83 0.86 0.75 0.71  CALCIUM 8.9 8.6 8.5 8.4 8.7   Liver Function Tests:  Recent Labs Lab 08/15/14 1905 08/16/14 0925 08/18/14 0645 08/19/14 0330 08/20/14 0737  AST 73* 45* 266* 238* 263*  ALT 41 29 155* 172* 248*  ALKPHOS 200* 160* 193* 194* 231*  BILITOT 0.8 1.1 0.4 1.1 0.3  PROT 8.1 6.5 6.0 5.7* 6.0  ALBUMIN 3.4* 2.7* 2.2* 2.1* 2.3*   No results for input(s): LIPASE, AMYLASE in the last 168 hours. No results for input(s): AMMONIA in the last 168 hours. CBC:  Recent Labs Lab 08/15/14 1905 08/16/14 0925 08/18/14 0645 08/19/14 0330 08/20/14 0737  WBC 12.3* 6.9 12.5* 12.1* 9.5  NEUTROABS 10.9*  --   --  10.2*  --   HGB 12.3* 11.5* 10.4* 10.8* 11.5*  HCT 37.2* 35.5* 32.3* 33.8* 35.8*  MCV 93.2 94.4 94.4 95.8 94.0  PLT 374 331 329 326 352   Cardiac Enzymes:  Recent Labs Lab 08/16/14 0132 08/16/14 0925 08/16/14 1555  TROPONINI 0.04* 0.03 0.03   BNP (last 3 results) No results for input(s): BNP in the last 8760 hours.  ProBNP (last 3 results) No results for input(s): PROBNP in the last 8760 hours.  CBG:  Recent Labs Lab 08/16/14 0655 08/18/14 0639 08/20/14 0657  GLUCAP 97 111* 89    No results found for this or any previous visit (from the past 240 hour(s)).   Studies: Ct Angio Head W/cm &/or Wo Cm  08/20/2014   ADDENDUM REPORT: 08/20/2014 11:23  ADDENDUM: Study discussed by telephone with Dr. Marvel Plan on 08/20/2014 at 11:22 .  He inquired also about a focal arterial dissection at the right carotid bifurcation (see sagittal image 50 of series 5011), but to me that more resembles another area of ulcerated soft plaque (in addition  to that described in this report in the left carotid and proximal left subclavian arteries).   Electronically Signed   By: Augusto GambleLee  Hall M.D.   On: 08/20/2014 11:23   08/20/2014   CLINICAL DATA:   81 year old male with altered mental status after a fall several days ago. Small acute infarct right PCA territory detected on MRI. Initial encounter.  Current history of left neck carotid stenting in 2002.  EXAM: CT ANGIOGRAPHY HEAD AND NECK  TECHNIQUE: Multidetector CT imaging of the head and neck was performed using the standard protocol during bolus administration of intravenous contrast. Multiplanar CT image reconstructions and MIPs were obtained to evaluate the vascular anatomy. Carotid stenosis measurements (when applicable) are obtained utilizing NASCET criteria, using the distal internal carotid diameter as the denominator.  CONTRAST:  50mL OMNIPAQUE IOHEXOL 350 MG/ML SOLN  COMPARISON:  Brain MRI 08/17/2014. To noncontrast head and cervical spine CT 08/15/2014  FINDINGS: CT HEAD  Brain: Chronic confluent encephalomalacia in the left MCA territory. No midline shift, mass effect, or evidence of intracranial mass lesion. There is patchy abnormal hyperdensity in the posterior right hemisphere which is stable to slightly progressed since 08/15/2014, most apparent on series 21, image 16. This corresponds to the areas of diffusion abnormality (series 4, image 26 on 08/17/2014, and appears to be in the sulcus.  Stable gray-white matter differentiation. No evidence of acute or subacute cortically based infarct identified.  Calvarium and skull base: Stable.  Paranasal sinuses: Clear.  Orbits: Stable.  CTA NECK  Aortic arch: 3 vessel arch configuration with moderate arch atherosclerosis. No hemodynamically significant great vessel origin stenosis (up to 50% stenosis of the left CCA origin).  Right carotid system: Soft and calcified plaque at the right CCA origin without hemodynamically significant stenosis. Intermittent soft and calcified plaque to the right carotid bifurcation. Moderate to severe plaque at the right carotid bifurcation resulting in high-grade stenosis approaching a radiographic string sign (series  509, image 134 and series 511, image 47). Despite this, the right ICA remains patent to the skullbase.  Left carotid system: Beyond its origin there is intermittent plaque in the left CCA, including ulcerated plaque at the level of the larynx (series 509, image 91). Vascular stent is in place from the distal left CCA through the distal ball all of the left ICA. There is moderate to severe in stent stenosis with soft plaque demonstrated at the proximal third of the stent (about 12 mm into the stent). See series 509, image 120 in 07/2019, and sagittal image 109 of series 5011. Numerically stenosis is estimated at up to 80 % with respect to the distal vessel. Beyond the stented aspect of the vessel, the left ICA is negative except for mild calcified plaque to the skullbase.  Vertebral arteries:Calcified plaque in the proximal right subclavian artery without hemodynamically significant stenosis, however, there is soft and calcified plaque resulting in high-grade stenosis (string sign) at the right vertebral artery origin. See series 503, image 129). Dominant right vertebral artery is patent despite this finding, and otherwise negative to the skullbase.  Soft and calcified plaque in the proximal left subclavian artery. Ulcerated plaque (series 509, image 51), with up to 50 % stenosis with respect to the distal vessel proximal to the left vertebral artery origin. Soft plaque at the left vertebral artery origin with moderate to severe stenosis best seen on series 509 image 65. Tortuous proximal left vertebral artery. The left vertebral artery is non dominant and tortuous in the neck with occasional  calcified plaque. It is patent to the skullbase without other hemodynamically significant stenosis identified.  Skeleton: Advanced degenerative changes in the cervical spine. T5 moderate to severe compression fracture is new since 2013, probably new since chest radiographs in 2014, stable from 08/15/2014. Evidence of acute  fracture planes on today's sagittal imaging (series 5011).  Other neck: Paraseptal emphysema and chronic lung scarring. Small layering right pleural effusion suspected and new. No superior mediastinal lymphadenopathy.  Thyroid, larynx, pharynx, parapharyngeal spaces, retropharyngeal space, sublingual space, submandibular glands, and parotid glands are within normal limits. No cervical lymphadenopathy.  CTA HEAD  Posterior circulation: Dominant distal right vertebral artery. Distal right vertebral calcified plaque but no hemodynamically significant stenosis of the distal vertebral arteries. Patent right PICA and bilateral AICA origins. Patent vertebrobasilar junction. No basilar artery stenosis. SCA and PCA origins are within normal limits. Posterior communicating arteries are diminutive or absent. Bilateral PCA branches are within normal limits.  Anterior circulation: Moderate ICA siphon calcified plaque. No petrous segment or cavernous segment stenosis. Mild bilateral supra clinoid ICA stenosis due to calcified plaque. Ophthalmic artery origins are within normal limits. Patent carotid termini, MCA and ACA origins.  Anterior communicating artery and bilateral ACA branches are within normal limits. Right MCA branches are within normal limits. Left MCA M1 segment and bifurcation are patent. Left MCA branches are smaller in caliber, but no major MCA branch occlusion is identified.  Venous sinuses: Within normal limits.  Anatomic variants: Dominant right vertebral artery.  Delayed phase: No abnormal enhancement identified.  IMPRESSION: 1. Small volume subarachnoid hemorrhage over the posterior right convexity, I believe it was these blood products resulting in the diffusion abnormality on the recent brain MRI, rather than small acute infarcts. This is likely this sequelae of recent fall, and has not significantly changed since 08/15/2014. 2. Moderate to severe T5 compression fracture appears to be acute. If specific  therapy such as vertebroplasty is desired, thoracic MRI or whole-body bone scan would best confirm acuity. 3. Left carotid in the neck was stented in 2002. There is high-grade in stent re- stenosis, numerically estimated at 80%. The left carotid system remains patent, with occasional left CCA ulcerated plaque. 4. High-grade right ICA stenosis related to soft and calcified plaque, approaching a RADIOGRAPHIC STRING SIGN. 5. High-grade right vertebral artery origin stenosis (STRING SIGN). The right vertebral is dominant. Moderate to severe left vertebral artery origin stenosis. 6. Moderate intracranial supra clinoid ICA siphon stenosis due to calcified plaque. Otherwise negative intracranial CTA. 7. Chronic left MCA infarct.  Electronically Signed: By: Augusto Gamble M.D. On: 08/20/2014 11:08   Ct Angio Neck W/cm &/or Wo/cm  08/20/2014   ADDENDUM REPORT: 08/20/2014 11:23  ADDENDUM: Study discussed by telephone with Dr. Marvel Plan on 08/20/2014 at 11:22 .  He inquired also about a focal arterial dissection at the right carotid bifurcation (see sagittal image 50 of series 5011), but to me that more resembles another area of ulcerated soft plaque (in addition to that described in this report in the left carotid and proximal left subclavian arteries).   Electronically Signed   By: Augusto Gamble M.D.   On: 08/20/2014 11:23   08/20/2014   CLINICAL DATA:  81 year old male with altered mental status after a fall several days ago. Small acute infarct right PCA territory detected on MRI. Initial encounter.  Current history of left neck carotid stenting in 2002.  EXAM: CT ANGIOGRAPHY HEAD AND NECK  TECHNIQUE: Multidetector CT imaging of the head and neck was performed  using the standard protocol during bolus administration of intravenous contrast. Multiplanar CT image reconstructions and MIPs were obtained to evaluate the vascular anatomy. Carotid stenosis measurements (when applicable) are obtained utilizing NASCET criteria, using the  distal internal carotid diameter as the denominator.  CONTRAST:  50mL OMNIPAQUE IOHEXOL 350 MG/ML SOLN  COMPARISON:  Brain MRI 08/17/2014. To noncontrast head and cervical spine CT 08/15/2014  FINDINGS: CT HEAD  Brain: Chronic confluent encephalomalacia in the left MCA territory. No midline shift, mass effect, or evidence of intracranial mass lesion. There is patchy abnormal hyperdensity in the posterior right hemisphere which is stable to slightly progressed since 08/15/2014, most apparent on series 21, image 16. This corresponds to the areas of diffusion abnormality (series 4, image 26 on 08/17/2014, and appears to be in the sulcus.  Stable gray-white matter differentiation. No evidence of acute or subacute cortically based infarct identified.  Calvarium and skull base: Stable.  Paranasal sinuses: Clear.  Orbits: Stable.  CTA NECK  Aortic arch: 3 vessel arch configuration with moderate arch atherosclerosis. No hemodynamically significant great vessel origin stenosis (up to 50% stenosis of the left CCA origin).  Right carotid system: Soft and calcified plaque at the right CCA origin without hemodynamically significant stenosis. Intermittent soft and calcified plaque to the right carotid bifurcation. Moderate to severe plaque at the right carotid bifurcation resulting in high-grade stenosis approaching a radiographic string sign (series 509, image 134 and series 511, image 47). Despite this, the right ICA remains patent to the skullbase.  Left carotid system: Beyond its origin there is intermittent plaque in the left CCA, including ulcerated plaque at the level of the larynx (series 509, image 91). Vascular stent is in place from the distal left CCA through the distal ball all of the left ICA. There is moderate to severe in stent stenosis with soft plaque demonstrated at the proximal third of the stent (about 12 mm into the stent). See series 509, image 120 in 07/2019, and sagittal image 109 of series 5011.  Numerically stenosis is estimated at up to 80 % with respect to the distal vessel. Beyond the stented aspect of the vessel, the left ICA is negative except for mild calcified plaque to the skullbase.  Vertebral arteries:Calcified plaque in the proximal right subclavian artery without hemodynamically significant stenosis, however, there is soft and calcified plaque resulting in high-grade stenosis (string sign) at the right vertebral artery origin. See series 503, image 129). Dominant right vertebral artery is patent despite this finding, and otherwise negative to the skullbase.  Soft and calcified plaque in the proximal left subclavian artery. Ulcerated plaque (series 509, image 51), with up to 50 % stenosis with respect to the distal vessel proximal to the left vertebral artery origin. Soft plaque at the left vertebral artery origin with moderate to severe stenosis best seen on series 509 image 65. Tortuous proximal left vertebral artery. The left vertebral artery is non dominant and tortuous in the neck with occasional calcified plaque. It is patent to the skullbase without other hemodynamically significant stenosis identified.  Skeleton: Advanced degenerative changes in the cervical spine. T5 moderate to severe compression fracture is new since 2013, probably new since chest radiographs in 2014, stable from 08/15/2014. Evidence of acute fracture planes on today's sagittal imaging (series 5011).  Other neck: Paraseptal emphysema and chronic lung scarring. Small layering right pleural effusion suspected and new. No superior mediastinal lymphadenopathy.  Thyroid, larynx, pharynx, parapharyngeal spaces, retropharyngeal space, sublingual space, submandibular glands, and parotid glands are  within normal limits. No cervical lymphadenopathy.  CTA HEAD  Posterior circulation: Dominant distal right vertebral artery. Distal right vertebral calcified plaque but no hemodynamically significant stenosis of the distal vertebral  arteries. Patent right PICA and bilateral AICA origins. Patent vertebrobasilar junction. No basilar artery stenosis. SCA and PCA origins are within normal limits. Posterior communicating arteries are diminutive or absent. Bilateral PCA branches are within normal limits.  Anterior circulation: Moderate ICA siphon calcified plaque. No petrous segment or cavernous segment stenosis. Mild bilateral supra clinoid ICA stenosis due to calcified plaque. Ophthalmic artery origins are within normal limits. Patent carotid termini, MCA and ACA origins.  Anterior communicating artery and bilateral ACA branches are within normal limits. Right MCA branches are within normal limits. Left MCA M1 segment and bifurcation are patent. Left MCA branches are smaller in caliber, but no major MCA branch occlusion is identified.  Venous sinuses: Within normal limits.  Anatomic variants: Dominant right vertebral artery.  Delayed phase: No abnormal enhancement identified.  IMPRESSION: 1. Small volume subarachnoid hemorrhage over the posterior right convexity, I believe it was these blood products resulting in the diffusion abnormality on the recent brain MRI, rather than small acute infarcts. This is likely this sequelae of recent fall, and has not significantly changed since 08/15/2014. 2. Moderate to severe T5 compression fracture appears to be acute. If specific therapy such as vertebroplasty is desired, thoracic MRI or whole-body bone scan would best confirm acuity. 3. Left carotid in the neck was stented in 2002. There is high-grade in stent re- stenosis, numerically estimated at 80%. The left carotid system remains patent, with occasional left CCA ulcerated plaque. 4. High-grade right ICA stenosis related to soft and calcified plaque, approaching a RADIOGRAPHIC STRING SIGN. 5. High-grade right vertebral artery origin stenosis (STRING SIGN). The right vertebral is dominant. Moderate to severe left vertebral artery origin stenosis. 6.  Moderate intracranial supra clinoid ICA siphon stenosis due to calcified plaque. Otherwise negative intracranial CTA. 7. Chronic left MCA infarct.  Electronically Signed: By: Augusto Gamble M.D. On: 08/20/2014 11:08   Mr Abdomen Wo Contrast  08/20/2014   CLINICAL DATA:  81 year old male with elevated liver enzymes. Evaluate for biliary tract obstruction.  EXAM: MRI ABDOMEN WITHOUT CONTRAST  TECHNIQUE: Multiplanar multisequence MR imaging was performed without the administration of intravenous contrast.  COMPARISON:  CT of the abdomen and pelvis 08/19/2014.  FINDINGS: Comments: Per report from the technologist, the patient refused to continue with the examination after acquisition of only 2 pulse sequences. The patient was removed from the scanner per his request at that time.  Very limited imaging was performed prior to removal of the patient from the scanner. This consisted of only coronal and axial T2 weighted images through the abdomen. On these limited images the gallbladder was nearly completely decompressed. No definite gallstones. No definite signs of pericholecystic fluid or inflammation. No intrahepatic biliary ductal dilatation. Common bile duct was normal in caliber measuring approximately 4 mm. Pancreatic duct also appears normal in caliber. No definite filling defects within the common bile duct to suggest choledocholithiasis at this time. No discrete hepatic lesions identified on T2 weighted sequences. However, both the spleen and liver appear diffusely low signal intensity on T2 weighted images, suggestive of iron deposition.  Multiple small T2 hyperintense lesions in the left kidney are incompletely characterize, but statistically likely tiny cysts. The appearance of the the right kidney and bilateral adrenal glands is unremarkable on today's examination.  IMPRESSION: 1. Exceedingly limited secondary to patient's request  to terminate the examination prior to completion. Despite the limitations of this  examination, there is no intra or extrahepatic biliary ductal dilatation to suggest biliary tract obstruction at this time. 2. T2 hypointensity throughout the liver and spleen may suggest iron deposition. 3. Additional findings, as above.   Electronically Signed   By: Trudie Reed M.D.   On: 08/20/2014 10:08   Ct Abdomen Pelvis W Contrast  08/19/2014   CLINICAL DATA:  Transaminitis.  EXAM: CT ABDOMEN AND PELVIS WITH CONTRAST  TECHNIQUE: Multidetector CT imaging of the abdomen and pelvis was performed using the standard protocol following bolus administration of intravenous contrast.  CONTRAST:  1 OMNIPAQUE IOHEXOL 300 MG/ML SOLN, 75mL OMNIPAQUE IOHEXOL 300 MG/ML SOLN  COMPARISON:  None.  FINDINGS: Small bilateral pleural effusions with basilar atelectasis. Peripheral fibrosis and honeycomb changes in the lungs suggesting usual interstitial pneumonitis. Emphysematous changes in the lung bases. Small pericardial effusion or thickening. Normal heart size. Coronary artery and aortic valve calcifications.  The liver, spleen, gallbladder, pancreas, adrenal glands, kidneys, inferior vena cava, and retroperitoneal lymph nodes are unremarkable. Diffuse calcification of the abdominal aorta and branch vessels. Infrarenal abdominal aortic aneurysm measuring 2.7 cm diameter, compared with 1.8 cm diameter proximally. Calcification renal artery, celiac axis, and superior mesenteric artery origins. Calcification of the iliac arteries. Stomach and small bowel are decompressed. Diffusely stool-filled colon without abnormal distention. No free air or free fluid in the abdomen.  Pelvis: There is diffuse thickening of the bladder wall with asymmetric thickening in the anterior dome. Tenting of the bladder. 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 Small adjacent bladder diverticulum is demonstrated. Prostate gland is not enlarged. No free or loculated pelvic fluid collections.  Appendix is normal. Diverticulosis of the sigmoid colon. Mild wall thickening in the sigmoid colon may represent early diverticulitis. No abscess. Diffuse bone demineralization with compression fractures demonstrated at L5, L4, L3, L2, L1, and T11 levels. This is likely due to osteoporosis. Degenerative changes in the lumbar spine and hips.  IMPRESSION: 1. Small bilateral pleural effusions with basilar atelectasis 2. Emphysematous changes in the lungs with peripheral fibrosis and honeycomb changes. 3. Diffuse vascular calcifications. Small infrarenal abdominal aortic aneurysm measuring 2.7 cm diameter. 4. Asymmetric thickening of the bladder wall with tenting suggesting urachal remnant. 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 5. Diverticulosis of the sigmoid colon with mild wall thickening suggesting early diverticulitis. No abscess. 6. Diffuse bone demineralization and multiple vertebral compression fractures consistent with osteoporosis.   Electronically Signed   By: Burman Nieves M.D.   On: 08/19/2014 00:51    Scheduled Meds: . antiseptic oral rinse  7 mL Mouth Rinse BID  . aspirin EC  81 mg Oral Daily  . donepezil  10 mg Oral QHS  . folic acid  1 mg Oral Daily  . furosemide  20 mg Oral Daily  . metoprolol tartrate  25 mg Oral BID  . mirtazapine  15 mg Oral QHS  . multivitamin with minerals  1 tablet Oral Daily  . pantoprazole  40 mg Oral Daily  . predniSONE  20 mg Oral Q breakfast  . thiamine  100 mg Oral Daily   Continuous Infusions:   Active Problems:   Fall   Encephalopathy due to infection   COPD exacerbation   Late effects of cerebrovascular disease   Dementia   Multiple rib fractures   Acute CVA (cerebrovascular accident)   Transaminitis   Urachal anomaly   Clavicle fracture  Time spent: 15 minutes.    Halana Deisher  [facility] Pager [phone]. If 7PM-7AM, please contact night-coverage at [email or web address], password [facility] 08/20/2014, 6:14 PM  LOS: 5  days

## 2014-08-20 NOTE — Progress Notes (Signed)
Speech Language Pathology Treatment: Dysphagia  Patient Details Name: James Mcneil MRN: [id number] DOB: 01/31/1923 Today's Date: 08/20/2014 Time: [id number] SLP Time Calculation (min) (ACUTE ONLY): 14 min  Assessment / Plan / Recommendation Clinical Impression  Pt appears to be tolerating current diet with no overt signs of aspiration observed. He remains afebrile, lung sounds unchanged. Daughter present reports coughing once with coffee at breakfast, but reports that he has been doing better without using straws. SLP provided Min cues for slower rate of intake to maximize safety, particularly given limited upright posture. Will continue to follow for tolerance.   HPI HPI: 81 y.o. male with hx of dementia, left CVA 2001 with chronic expressive aphasia, cared for at home by family, admitted 08/15/14 s/p slip (incontinent of bowels) and fall in bathroom; +LOC x 5minutes; ?Lt 7-8 anterior rib fxs; Rt clavicle fx; bil elbow skin tears, COPD exacerbation, concerns for aspiration pneumonitis.  MRI reveals small acute infarction in the right parieto-occipital region without hemorrhage or mass effect. Old left MCA territory infarction.    Pertinent Vitals Pain Assessment: Faces Faces Pain Scale: No hurt Pain Location: R shoulder and ribs  Pain Descriptors / Indicators: Grimacing Pain Intervention(s): Monitored during session;Limited activity within patient's tolerance;Repositioned  SLP Plan  Continue with current plan of care    Recommendations Diet recommendations: Regular;Thin liquid Liquids provided via: Cup;No straw Medication Administration: Whole meds with liquid Supervision: Patient able to self feed Compensations: Slow rate;Small sips/bites Postural Changes and/or Swallow Maneuvers: Seated upright 90 degrees              Oral Care Recommendations: Oral care BID Follow up Recommendations:  (none anticipated) Plan: Continue with current plan of care    GO     James Mcneil,  M.A. CCC-SLP [phone]  James Mcneil, James Mcneil 08/20/2014, 3:32 PM

## 2014-08-21 LAB — CBC
HCT: 32.6 % — ABNORMAL LOW (ref 39.0–52.0)
HEMOGLOBIN: 10.7 g/dL — AB (ref 13.0–17.0)
MCH: 31.1 pg (ref 26.0–34.0)
MCHC: 32.8 g/dL (ref 30.0–36.0)
MCV: 94.8 fL (ref 78.0–100.0)
PLATELETS: 328 10*3/uL (ref 150–400)
RBC: 3.44 MIL/uL — AB (ref 4.22–5.81)
RDW: 16.1 % — AB (ref 11.5–15.5)
WBC: 7.9 10*3/uL (ref 4.0–10.5)

## 2014-08-21 LAB — COMPREHENSIVE METABOLIC PANEL
ALK PHOS: 236 U/L — AB (ref 39–117)
ALT: 294 U/L — ABNORMAL HIGH (ref 0–53)
AST: 284 U/L — ABNORMAL HIGH (ref 0–37)
Albumin: 2.3 g/dL — ABNORMAL LOW (ref 3.5–5.2)
Anion gap: 6 (ref 5–15)
BILIRUBIN TOTAL: 0.4 mg/dL (ref 0.3–1.2)
BUN: 25 mg/dL — AB (ref 6–23)
CALCIUM: 8.6 mg/dL (ref 8.4–10.5)
CHLORIDE: 103 mmol/L (ref 96–112)
CO2: 30 mmol/L (ref 19–32)
CREATININE: 0.8 mg/dL (ref 0.50–1.35)
GFR calc Af Amer: 89 mL/min — ABNORMAL LOW (ref >90)
GFR, EST NON AFRICAN AMERICAN: 77 mL/min — AB (ref >90)
Glucose, Bld: 104 mg/dL — ABNORMAL HIGH (ref 70–99)
Potassium: 4.4 mmol/L (ref 3.5–5.1)
SODIUM: 139 mmol/L (ref 135–145)
TOTAL PROTEIN: 5.8 g/dL — AB (ref 6.0–8.3)

## 2014-08-21 LAB — GLUCOSE, CAPILLARY: Glucose-Capillary: 96 mg/dL (ref 70–99)

## 2014-08-21 MED ORDER — AMOXICILLIN-POT CLAVULANATE 500-125 MG PO TABS: 1.0000 | ORAL_TABLET | Freq: Three times a day (TID) | ORAL | Status: AC

## 2014-08-21 NOTE — Progress Notes (Signed)
Patient discharged home with daughter. Discharge instructions and medications reviewed with patient and patient's family. Patient's family state they understood the instructions. IV removed.

## 2014-08-21 NOTE — Discharge Instructions (Signed)
STROKE/TIA DISCHARGE INSTRUCTIONS SMOKING Cigarette smoking nearly doubles your risk of having a stroke & is the single most alterable risk factor  If you smoke or have smoked in the last 12 months, you are advised to quit smoking for your health.  Most of the excess cardiovascular risk related to smoking disappears within a year of stopping.  Ask you doctor about anti-smoking medications  [place] Quit Line: 1-800-QUIT NOW  Free Smoking Cessation Classes [phone]  CHOLESTEROL Know your levels; limit fat & cholesterol in your diet  Lipid Panel     Component Value Date/Time   CHOL 125 08/19/2014 0330   TRIG 59 08/19/2014 0330   HDL 54 08/19/2014 0330   CHOLHDL 2.3 08/19/2014 0330   VLDL 12 08/19/2014 0330   LDLCALC 59 08/19/2014 0330      Many patients benefit from treatment even if their cholesterol is at goal.  Goal: Total Cholesterol (CHOL) less than 160  Goal:  Triglycerides (TRIG) less than 150  Goal:  HDL greater than 40  Goal:  LDL (LDLCALC) less than 100   BLOOD PRESSURE American Stroke Association blood pressure target is less that 120/80 mm/Hg  Your discharge blood pressure is:  BP: (!) 112/58 mmHg  Monitor your blood pressure  Limit your salt and alcohol intake  Many individuals will require more than one medication for high blood pressure  DIABETES (A1c is a blood sugar average for last 3 months) Goal HGBA1c is under 7% (HBGA1c is blood sugar average for last 3 months)  Diabetes: No known diagnosis of diabetes    Lab Results  Component Value Date   HGBA1C 6.2* 08/19/2014     Your HGBA1c can be lowered with medications, healthy diet, and exercise.  Check your blood sugar as directed by your physician  Call your physician if you experience unexplained or low blood sugars.  PHYSICAL ACTIVITY/REHABILITATION Goal is 30 minutes at least 4 days per week  Activity: Increase activity slowly, and Walk with assistance, Therapies: Physical Therapy: Home Health,  Occupational Therapy: Home Health and Speech Therapy: Home Health  Activity decreases your risk of heart attack and stroke and makes your heart stronger.  It helps control your weight and blood pressure; helps you relax and can improve your mood.  Participate in a regular exercise program.  Talk with your doctor about the best form of exercise for you (dancing, walking, swimming, cycling).  DIET/WEIGHT Goal is to maintain a healthy weight  Your discharge diet is: Diet heart healthy/carb modified Diet - low sodium heart healthy, thin liquids Your height is:  Height: 5\' 4"  (162.6 cm) Your current weight is: Weight: 55.384 kg (122 lb 1.6 oz)  Following the type of diet specifically designed for you will help prevent another stroke.  Your goal Body Mass Index (BMI) is 19-24.  Healthy food habits can help reduce 3 risk factors for stroke:  High cholesterol, hypertension, and excess weight.  RESOURCES Stroke/Support Group:  Call [phone]   STROKE EDUCATION PROVIDED/REVIEWED AND GIVEN TO PATIENT Stroke warning signs and symptoms How to activate emergency medical system (call 911). Medications prescribed at discharge. Need for follow-up after discharge. Personal risk factors for stroke. Pneumonia vaccine given: No Flu vaccine given: No My questions have been answered, the writing is legible, and I understand these instructions.  I will adhere to these goals & educational materials that have been provided to me after my discharge from the hospital.    Fat and Cholesterol Control Diet Fat and cholesterol  levels in your blood and organs are influenced by your diet. High levels of fat and cholesterol may lead to diseases of the heart, small and large blood vessels, gallbladder, liver, and pancreas. CONTROLLING FAT AND CHOLESTEROL WITH DIET Although exercise and lifestyle factors are important, your diet is key. That is because certain foods are known to raise cholesterol and others to lower  it. The goal is to balance foods for their effect on cholesterol and more importantly, to replace saturated and trans fat with other types of fat, such as monounsaturated fat, polyunsaturated fat, and omega-3 fatty acids. On average, a person should consume no more than 15 to 17 g of saturated fat daily. Saturated and trans fats are considered "bad" fats, and they will raise LDL cholesterol. Saturated fats are primarily found in animal products such as meats, butter, and cream. However, that does not mean you need to give up all your favorite foods. Today, there are good tasting, low-fat, low-cholesterol substitutes for most of the things you like to eat. Choose low-fat or nonfat alternatives. Choose round or loin cuts of red meat. These types of cuts are lowest in fat and cholesterol. Chicken (without the skin), fish, veal, and ground [place] breast are great choices. Eliminate fatty meats, such as hot dogs and salami. Even shellfish have little or no saturated fat. Have a 3 oz (85 g) portion when you eat lean meat, poultry, or fish. Trans fats are also called "partially hydrogenated oils." They are oils that have been scientifically manipulated so that they are solid at room temperature resulting in a longer shelf life and improved taste and texture of foods in which they are added. Trans fats are found in stick margarine, some tub margarines, cookies, crackers, and baked goods.  When baking and cooking, oils are a great substitute for butter. The monounsaturated oils are especially beneficial since it is believed they lower LDL and raise HDL. The oils you should avoid entirely are saturated tropical oils, such as coconut and palm.  Remember to eat a lot from food groups that are naturally free of saturated and trans fat, including fish, fruit, vegetables, beans, grains (barley, rice, couscous, bulgur wheat), and pasta (without cream sauces).  IDENTIFYING FOODS THAT LOWER FAT AND CHOLESTEROL  Soluble fiber  may lower your cholesterol. This type of fiber is found in fruits such as apples, vegetables such as broccoli, potatoes, and carrots, legumes such as beans, peas, and lentils, and grains such as barley. Foods fortified with plant sterols (phytosterol) may also lower cholesterol. You should eat at least 2 g per day of these foods for a cholesterol lowering effect.  Read package labels to identify low-saturated fats, trans fat free, and low-fat foods at the supermarket. Select cheeses that have only 2 to 3 g saturated fat per ounce. Use a heart-healthy tub margarine that is free of trans fats or partially hydrogenated oil. When buying baked goods (cookies, crackers), avoid partially hydrogenated oils. Breads and muffins should be made from whole grains (whole-wheat or whole oat flour, instead of "flour" or "enriched flour"). Buy non-creamy canned soups with reduced salt and no added fats.  FOOD PREPARATION TECHNIQUES  Never deep-fry. If you must fry, either stir-fry, which uses very little fat, or use non-stick cooking sprays. When possible, broil, bake, or roast meats, and steam vegetables. Instead of putting butter or margarine on vegetables, use lemon and herbs, applesauce, and cinnamon (for squash and sweet potatoes). Use nonfat yogurt, salsa, and low-fat dressings for salads.  LOW-SATURATED FAT / LOW-FAT FOOD SUBSTITUTES Meats / Saturated Fat (g)  Avoid: Steak, marbled (3 oz/85 g) / 11 g  Choose: Steak, lean (3 oz/85 g) / 4 g  Avoid: Hamburger (3 oz/85 g) / 7 g  Choose: Hamburger, lean (3 oz/85 g) / 5 g  Avoid: Ham (3 oz/85 g) / 6 g  Choose: Ham, lean cut (3 oz/85 g) / 2.4 g  Avoid: Chicken, with skin, dark meat (3 oz/85 g) / 4 g  Choose: Chicken, skin removed, dark meat (3 oz/85 g) / 2 g  Avoid: Chicken, with skin, light meat (3 oz/85 g) / 2.5 g  Choose: Chicken, skin removed, light meat (3 oz/85 g) / 1 g Dairy / Saturated Fat (g)  Avoid: Whole milk (1 cup) / 5 g  Choose: Low-fat  milk, 2% (1 cup) / 3 g  Choose: Low-fat milk, 1% (1 cup) / 1.5 g  Choose: Skim milk (1 cup) / 0.3 g  Avoid: Hard cheese (1 oz/28 g) / 6 g  Choose: Skim milk cheese (1 oz/28 g) / 2 to 3 g  Avoid: Cottage cheese, 4% fat (1 cup) / 6.5 g  Choose: Low-fat cottage cheese, 1% fat (1 cup) / 1.5 g  Avoid: Ice cream (1 cup) / 9 g  Choose: Sherbet (1 cup) / 2.5 g  Choose: Nonfat frozen yogurt (1 cup) / 0.3 g  Choose: Frozen fruit bar / trace  Avoid: Whipped cream (1 tbs) / 3.5 g  Choose: Nondairy whipped topping (1 tbs) / 1 g Condiments / Saturated Fat (g)  Avoid: Mayonnaise (1 tbs) / 2 g  Choose: Low-fat mayonnaise (1 tbs) / 1 g  Avoid: Butter (1 tbs) / 7 g  Choose: Extra light margarine (1 tbs) / 1 g  Avoid: Coconut oil (1 tbs) / 11.8 g  Choose: Olive oil (1 tbs) / 1.8 g  Choose: Corn oil (1 tbs) / 1.7 g  Choose: Safflower oil (1 tbs) / 1.2 g  Choose: Sunflower oil (1 tbs) / 1.4 g  Choose: Soybean oil (1 tbs) / 2.4 g  Choose: Canola oil (1 tbs) / 1 g Document Released: 07/06/2005 Document Revised: 10/31/2012 Document Reviewed: 10/04/2013 ExitCare Patient Information 2015 [place], [place]. This information is not intended to replace advice given to you by your health care provider. Make sure you discuss any questions you have with your health care provider.  Amoxicillin; Clavulanic Acid tablets What is this medicine? AMOXICILLIN; CLAVULANIC ACID (a mox i SIL in; KLAV yoo lan ic AS id) is a penicillin antibiotic. It is used to treat certain kinds of bacterial infections. It will not work for colds, flu, or other viral infections. This medicine may be used for other purposes; ask your health care provider or pharmacist if you have questions. COMMON BRAND NAME(S): Augmentin What should I tell my health care provider before I take this medicine? They need to know if you have any of these conditions: -bowel disease, like colitis -kidney disease -liver  disease -mononucleosis -an unusual or allergic reaction to amoxicillin, penicillin, cephalosporin, other antibiotics, clavulanic acid, other medicines, foods, dyes, or preservatives -pregnant or trying to get pregnant -breast-feeding How should I use this medicine? Take this medicine by mouth with a full glass of water. Follow the directions on the prescription label. Take at the start of a meal. Do not crush or chew. If the tablet has a score line, you may cut it in half at the score line for easier swallowing. Take  your medicine at regular intervals. Do not take your medicine more often than directed. Take all of your medicine as directed even if you think you are better. Do not skip doses or stop your medicine early. Talk to your pediatrician regarding the use of this medicine in children. Special care may be needed. Overdosage: If you think you have taken too much of this medicine contact a poison control center or emergency room at once. NOTE: This medicine is only for you. Do not share this medicine with others. What if I miss a dose? If you miss a dose, take it as soon as you can. If it is almost time for your next dose, take only that dose. Do not take double or extra doses. What may interact with this medicine? -allopurinol -anticoagulants -birth control pills -methotrexate -probenecid This list may not describe all possible interactions. Give your health care provider a list of all the medicines, herbs, non-prescription drugs, or dietary supplements you use. Also tell them if you smoke, drink alcohol, or use illegal drugs. Some items may interact with your medicine. What should I watch for while using this medicine? Tell your doctor or health care professional if your symptoms do not improve. Do not treat diarrhea with over the counter products. Contact your doctor if you have diarrhea that lasts more than 2 days or if it is severe and watery. If you have diabetes, you may get a  false-positive result for sugar in your urine. Check with your doctor or health care professional. Birth control pills may not work properly while you are taking this medicine. Talk to your doctor about using an extra method of birth control. What side effects may I notice from receiving this medicine? Side effects that you should report to your doctor or health care professional as soon as possible: -allergic reactions like skin rash, itching or hives, swelling of the face, lips, or tongue -breathing problems -dark urine -fever or chills, sore throat -redness, blistering, peeling or loosening of the skin, including inside the mouth -seizures -trouble passing urine or change in the amount of urine -unusual bleeding, bruising -unusually weak or tired -white patches or sores in the mouth or throat Side effects that usually do not require medical attention (report to your doctor or health care professional if they continue or are bothersome): -diarrhea -dizziness -headache -nausea, vomiting -stomach upset -vaginal or anal irritation This list may not describe all possible side effects. Call your doctor for medical advice about side effects. You may report side effects to FDA at 1-800-FDA-1088. Where should I keep my medicine? Keep out of the reach of children. Store at room temperature below 25 degrees C (77 degrees F). Keep container tightly closed. Throw away any unused medicine after the expiration date. NOTE: This sheet is a summary. It may not cover all possible information. If you have questions about this medicine, talk to your doctor, pharmacist, or health care provider.  2015, [employer]. (2007-09-29 12:04:30)

## 2014-08-21 NOTE — Discharge Summary (Signed)
James Mcneil, is a 81 y.o. male  DOB [birth year]  MRN [id number].  Admission date:  08/15/2014  Admitting Physician  Yevonne Pax, MD  Discharge Date:  08/21/2014   Primary MD  Rene Paci, MD  Recommendations for primary care physician for things to follow:   Please follow LFT's, would avoid statins.   Admission Diagnosis  Fall [W19.XXXA]   Discharge Diagnosis  Fall [W19.XXXA]   Active Problems:   Fall   Late effects of cerebrovascular disease   Dementia   Multiple rib fractures   Acute CVA (cerebrovascular accident)   Transaminitis   Urachal anomaly   Clavicle fracture   Concussion   SAH (subarachnoid hemorrhage)      Past Medical History  Diagnosis Date  . APHASIA, LE CEREBROVASCULAR DISEASE 2002    expressive  . HYPERGLYCEMIA   . HYPERLIPIDEMIA NEC/NOS   . HYPERTENSION   . Stroke 2002    residual R>LUE and mild exp aphasia  . COPD     Past Surgical History  Procedure Laterality Date  . No past surgeries         History of present illness and  Hospital Course:     Kindly see H&P for history of present illness and admission details, please review complete Labs, Consult reports and Test reports for all details in brief  HPI  from the history and physical done on the day of admission    Hospital Course  James Mcneil is a pleasant 81 y.o. male with COPD/dementia/essential hypertension/hyperlipidemia/Old CVA which happened in 2001 resulting in residual right-sided hemiaplagia , among other medical problems, who presented after a fall associated with bladder and bowel incontinence as well as altered sensorium. His workup in the ED was unremarkable but he  later developed low-grade fever and respiratory symptoms suggesting acute exacerbation of COPD(white count 12,000 on the day of admission),  which may have contributed to the fall. There was concern for broken ribs, and patient was also found to have right clavicular fracture. Repeat chest x-ray(post hydration) suggested chronic pulmonary fibrosis not mentioned on x-ray day of admission. I suspect these changes are due to aspiration pneumonitis. Patient did well on  antibiotics/systemic steroids/bronchodilators and oxygen supplementation as needed and incentive spirometer. Patient's family also mentioned that his right side was weaker during the course of her hospital stay, prompting brain MRI which showed "Small acute infarction in the right parieto-occipital region without hemorrhage or mass effect. Old left MCA territory infarction". CTA head/neck then showed "1. Small volume subarachnoid hemorrhage over the posterior right convexity, I believe it was these blood products resulting in the diffusion abnormality on the recent brain MRI, rather than small acute infarcts. This is likely this sequelae of recent fall, and has not significantly changed since 08/15/2014. 2. Moderate to severe T5 compression fracture appears to be acute. If specific therapy such as vertebroplasty is desired, thoracic MRI or whole-body bone scan would best confirm acuity. 3. Left carotid in the neck was stented in 2002. There is  high-grade in stent re- stenosis, numerically estimated at 80%. The left carotid system remains patent, with occasional left CCA ulcerated plaque. 4. High-grade right ICA stenosis related to soft and calcified plaque, approaching a RADIOGRAPHIC STRING SIGN. 5. High-grade right vertebral artery origin stenosis (STRING SIGN). The right vertebral is dominant. Moderate to severe left vertebral artery origin stenosis. 6. Moderate intracranial supra clinoid ICA siphon stenosis due to calcified plaque. Otherwise negative intracranial CTA. 7. Chronic left MCA infarct". 2-D echocardiogram showed normal EF with moderate aortic stenosis. He was seen by speech  therapy who recommended regular diet. Incidentally, patient was noted to have persistently elevated LFTs despite holding statin(Zocor), prompting CT abdomen and pelvis which showed "1. Small bilateral pleural effusions with basilar atelectasis 2. Emphysematous changes in the lungs with peripheral fibrosis and honeycomb changes. 3. Diffuse vascular calcifications. Small infrarenal abdominal aortic aneurysm measuring 2.7 cm diameter. 4. Asymmetric thickening of the bladder wall with tenting suggesting urachal remnant. 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 5. Diverticulosis of the sigmoid colon with mild wall thickening suggesting early diverticulitis. No abscess. 6. Diffuse bone demineralization and multiple vertebral compression fractures consistent with osteoporosis". He then had MRCP which showed "1. Exceedingly limited secondary to patient's request to terminate the examination prior to completion. Despite the limitations of this examination, there is no intra or extrahepatic biliary ductal dilatation to suggest biliary tract obstruction at this time. 2. T2 hypointensity throughout the liver and spleen may suggest iron deposition". Patient is clinically better with improvement in white count and appetite. He will continue course of antibiotics with Augmentin to complete 4 more days. At this point neurology didn't recommend further intervention for the subarachnoid bleed other than continuing low-dose aspirin and physical therapy. Unfortunately, patient not a candidate for acute intervention for the high-grade stent restenosis. Family opted for no further workup of the gallbladder as he would not be a good surgical candidate. Will discontinue Zocor long-term. Patient was evaluated by physical therapy were recommended home health physical therapy with 24 hour supervision. Family say they can provide 24-hour supervision. If they struggled to manage at home, they know to contact patient's primary care  provider and consider short-term rehabilitation placement. Patient appears to be at his baseline and will therefore be discharged home with home health services today.  Discharge Condition: Stable.   Follow UP Primary care provider in 1-2 weeks.     Discharge Instructions  and  Discharge Medications    Discharge Instructions    Diet - low sodium heart healthy    Complete by:  As directed      Increase activity slowly    Complete by:  As directed             Medication List    STOP taking these medications        simvastatin 20 MG tablet  Commonly known as:  ZOCOR      TAKE these medications        acetaminophen 325 MG tablet  Commonly known as:  TYLENOL  Take 2 tablets (650 mg total) by mouth 2 (two) times daily at 10 AM and 5 PM.     amoxicillin-clavulanate 500-125 MG per tablet  Commonly known as:  AUGMENTIN  Take 1 tablet (500 mg total) by mouth 3 (three) times daily.     aspirin 81 MG tablet  Take 81 mg by mouth daily.     donepezil 10 MG tablet  Commonly known as:  ARICEPT  Take 1 tablet (10  mg total) by mouth at bedtime.     furosemide 20 MG tablet  Commonly known as:  LASIX  TAKE ONE TABLET BY MOUTH ONCE DAILY     metoprolol tartrate 25 MG tablet  Commonly known as:  LOPRESSOR  Take 1 tablet (25 mg total) by mouth 2 (two) times daily.     mirtazapine 15 MG tablet  Commonly known as:  REMERON  Take 1 tablet (15 mg total) by mouth at bedtime.     Omega-3 Fish Oil 1200 MG Caps  Take 1,200 mg by mouth daily.     pantoprazole 40 MG tablet  Commonly known as:  PROTONIX  TAKE ONE TABLET BY MOUTH EVERY DAY     tiZANidine 4 MG tablet  Commonly known as:  ZANAFLEX  Take 1 tablet (4 mg total) by mouth every 8 (eight) hours as needed (muscle spasm).     traMADol 50 MG tablet  Commonly known as:  ULTRAM  Take 1 tablet (50 mg total) by mouth every 8 (eight) hours as needed for pain.          Diet and Activity recommendation: See Discharge  Instructions above   Consults obtained - Neurology.   Major procedures and Radiology Reports - PLEASE review detailed and final reports for all details, in brief -    Ct Angio Head W/cm &/or Wo Cm  08/20/2014   ADDENDUM REPORT: 08/20/2014 11:23  ADDENDUM: Study discussed by telephone with Dr. Marvel PlanJINDONG XU on 08/20/2014 at 11:22 .  He inquired also about a focal arterial dissection at the right carotid bifurcation (see sagittal image 50 of series 5011), but to me that more resembles another area of ulcerated soft plaque (in addition to that described in this report in the left carotid and proximal left subclavian arteries).   Electronically Signed   By: Augusto GambleLee  Hall M.D.   On: 08/20/2014 11:23   08/20/2014   CLINICAL DATA:  81 year old male with altered mental status after a fall several days ago. Small acute infarct right PCA territory detected on MRI. Initial encounter.  Current history of left neck carotid stenting in 2002.  EXAM: CT ANGIOGRAPHY HEAD AND NECK  TECHNIQUE: Multidetector CT imaging of the head and neck was performed using the standard protocol during bolus administration of intravenous contrast. Multiplanar CT image reconstructions and MIPs were obtained to evaluate the vascular anatomy. Carotid stenosis measurements (when applicable) are obtained utilizing NASCET criteria, using the distal internal carotid diameter as the denominator.  CONTRAST:  50mL OMNIPAQUE IOHEXOL 350 MG/ML SOLN  COMPARISON:  Brain MRI 08/17/2014. To noncontrast head and cervical spine CT 08/15/2014  FINDINGS: CT HEAD  Brain: Chronic confluent encephalomalacia in the left MCA territory. No midline shift, mass effect, or evidence of intracranial mass lesion. There is patchy abnormal hyperdensity in the posterior right hemisphere which is stable to slightly progressed since 08/15/2014, most apparent on series 21, image 16. This corresponds to the areas of diffusion abnormality (series 4, image 26 on 08/17/2014, and appears to  be in the sulcus.  Stable gray-white matter differentiation. No evidence of acute or subacute cortically based infarct identified.  Calvarium and skull base: Stable.  Paranasal sinuses: Clear.  Orbits: Stable.  CTA NECK  Aortic arch: 3 vessel arch configuration with moderate arch atherosclerosis. No hemodynamically significant great vessel origin stenosis (up to 50% stenosis of the left CCA origin).  Right carotid system: Soft and calcified plaque at the right CCA origin without hemodynamically significant stenosis. Intermittent soft and  calcified plaque to the right carotid bifurcation. Moderate to severe plaque at the right carotid bifurcation resulting in high-grade stenosis approaching a radiographic string sign (series 509, image 134 and series 511, image 47). Despite this, the right ICA remains patent to the skullbase.  Left carotid system: Beyond its origin there is intermittent plaque in the left CCA, including ulcerated plaque at the level of the larynx (series 509, image 91). Vascular stent is in place from the distal left CCA through the distal ball all of the left ICA. There is moderate to severe in stent stenosis with soft plaque demonstrated at the proximal third of the stent (about 12 mm into the stent). See series 509, image 120 in 07/2019, and sagittal image 109 of series 5011. Numerically stenosis is estimated at up to 80 % with respect to the distal vessel. Beyond the stented aspect of the vessel, the left ICA is negative except for mild calcified plaque to the skullbase.  Vertebral arteries:Calcified plaque in the proximal right subclavian artery without hemodynamically significant stenosis, however, there is soft and calcified plaque resulting in high-grade stenosis (string sign) at the right vertebral artery origin. See series 503, image 129). Dominant right vertebral artery is patent despite this finding, and otherwise negative to the skullbase.  Soft and calcified plaque in the proximal left  subclavian artery. Ulcerated plaque (series 509, image 51), with up to 50 % stenosis with respect to the distal vessel proximal to the left vertebral artery origin. Soft plaque at the left vertebral artery origin with moderate to severe stenosis best seen on series 509 image 65. Tortuous proximal left vertebral artery. The left vertebral artery is non dominant and tortuous in the neck with occasional calcified plaque. It is patent to the skullbase without other hemodynamically significant stenosis identified.  Skeleton: Advanced degenerative changes in the cervical spine. T5 moderate to severe compression fracture is new since 2013, probably new since chest radiographs in 2014, stable from 08/15/2014. Evidence of acute fracture planes on today's sagittal imaging (series 5011).  Other neck: Paraseptal emphysema and chronic lung scarring. Small layering right pleural effusion suspected and new. No superior mediastinal lymphadenopathy.  Thyroid, larynx, pharynx, parapharyngeal spaces, retropharyngeal space, sublingual space, submandibular glands, and parotid glands are within normal limits. No cervical lymphadenopathy.  CTA HEAD  Posterior circulation: Dominant distal right vertebral artery. Distal right vertebral calcified plaque but no hemodynamically significant stenosis of the distal vertebral arteries. Patent right PICA and bilateral AICA origins. Patent vertebrobasilar junction. No basilar artery stenosis. SCA and PCA origins are within normal limits. Posterior communicating arteries are diminutive or absent. Bilateral PCA branches are within normal limits.  Anterior circulation: Moderate ICA siphon calcified plaque. No petrous segment or cavernous segment stenosis. Mild bilateral supra clinoid ICA stenosis due to calcified plaque. Ophthalmic artery origins are within normal limits. Patent carotid termini, MCA and ACA origins.  Anterior communicating artery and bilateral ACA branches are within normal limits.  Right MCA branches are within normal limits. Left MCA M1 segment and bifurcation are patent. Left MCA branches are smaller in caliber, but no major MCA branch occlusion is identified.  Venous sinuses: Within normal limits.  Anatomic variants: Dominant right vertebral artery.  Delayed phase: No abnormal enhancement identified.  IMPRESSION: 1. Small volume subarachnoid hemorrhage over the posterior right convexity, I believe it was these blood products resulting in the diffusion abnormality on the recent brain MRI, rather than small acute infarcts. This is likely this sequelae of recent fall, and has not  significantly changed since 08/15/2014. 2. Moderate to severe T5 compression fracture appears to be acute. If specific therapy such as vertebroplasty is desired, thoracic MRI or whole-body bone scan would best confirm acuity. 3. Left carotid in the neck was stented in 2002. There is high-grade in stent re- stenosis, numerically estimated at 80%. The left carotid system remains patent, with occasional left CCA ulcerated plaque. 4. High-grade right ICA stenosis related to soft and calcified plaque, approaching a RADIOGRAPHIC STRING SIGN. 5. High-grade right vertebral artery origin stenosis (STRING SIGN). The right vertebral is dominant. Moderate to severe left vertebral artery origin stenosis. 6. Moderate intracranial supra clinoid ICA siphon stenosis due to calcified plaque. Otherwise negative intracranial CTA. 7. Chronic left MCA infarct.  Electronically Signed: By: Augusto Gamble M.D. On: 08/20/2014 11:08   Dg Chest 2 View  08/15/2014   CLINICAL DATA:  Status post fall at home. Hit head. Right-sided weakness.  EXAM: CHEST  2 VIEW  COMPARISON:  09/02/2011, 04/05/2013  FINDINGS: There is right upper lobe and left basilar chronic interstitial disease. There is right basilar airspace disease which may reflect atelectasis versus scarring.  No focal consolidation, pleural effusion or pneumothorax.  Stable cardiomediastinal  silhouette.  Possible left anterior lateral seventh and eighth rib fractures.  IMPRESSION: No active cardiopulmonary disease.  Possible left anterior lateral seventh and eighth rib fractures.   Electronically Signed   By: Elige Ko   On: 08/15/2014 20:31   Dg Pelvis 1-2 Views  08/15/2014   CLINICAL DATA:  Status post fall  EXAM: PELVIS - 1-2 VIEW  COMPARISON:  None.  FINDINGS: Generalized osteopenia. No fracture or dislocation. Mild degenerative changes of bilateral SI joints. Peripheral vascular atherosclerotic disease.  IMPRESSION: No acute osseous injury of the pelvis.   Electronically Signed   By: Elige Ko   On: 08/15/2014 20:32   Ct Head Wo Contrast  08/15/2014   CLINICAL DATA:  Status post fall but did not lose consciousness after falling.  EXAM: CT HEAD WITHOUT CONTRAST  CT CERVICAL SPINE WITHOUT CONTRAST  TECHNIQUE: Multidetector CT imaging of the head and cervical spine was performed following the standard protocol without intravenous contrast. Multiplanar CT image reconstructions of the cervical spine were also generated.  COMPARISON:  None.  FINDINGS: CT HEAD FINDINGS  There is no evidence of mass effect, midline shift, or extra-axial fluid collections. There is no evidence of a space-occupying lesion or intracranial hemorrhage. There is no evidence of a cortical-based area of acute infarction. There is a large left MCA territory infarct with encephalomalacia. There is generalized cerebral atrophy. There is periventricular white matter low attenuation likely secondary to microangiopathy.  The ventricles and sulci are appropriate for the patient's age. The basal cisterns are patent.  Visualized portions of the orbits are unremarkable. The visualized portions of the paranasal sinuses and mastoid air cells are unremarkable. Cerebrovascular atherosclerotic calcifications are noted.  The osseous structures are unremarkable.  CT CERVICAL SPINE FINDINGS  The alignment is anatomic. The vertebral body  heights are maintained. There is no acute fracture. There is no static listhesis. The prevertebral soft tissues are normal. The intraspinal soft tissues are not fully imaged on this examination due to poor soft tissue contrast, but there is no gross soft tissue abnormality.  There is degenerative disc disease at C3-4, C5-6 and C6-7. There is osseous fusion of the right posterior elements at C3-4. There is severe right and moderate left facet arthropathy at C2-3 with bilateral foraminal narrowing. There is bilateral  facet arthropathy, right worse than left, at C3-4 with bilateral uncovertebral degenerative changes resulting in severe bilateral foraminal encroachment. Bilateral facet arthropathy at C4-5. Bilateral facet arthropathy at C5-6 with a broad-based disc osteophyte complex and bilateral uncovertebral degenerative changes resulting in foraminal encroachment.  There is right carotid artery atherosclerosis. There is a left carotid artery stent. There is a 10 mm spiculated subpleural right upper lobe pulmonary nodule which may reflect an area of scarring. There are right apical blebs.  IMPRESSION: 1. No acute intracranial pathology. 2. No acute osseous injury of the cervical spine. 3. 10 mm spiculated subpleural right upper lobe pulmonary nodule which is more prominent compared with CT chest dated 09/09/2011. A dedicated non emergent CT of the chest is recommended for further evaluation. This may reflect scarring versus malignancy.   Electronically Signed   By: Elige Ko   On: 08/15/2014 20:15   Ct Angio Neck W/cm &/or Wo/cm  08/20/2014   ADDENDUM REPORT: 08/20/2014 11:23  ADDENDUM: Study discussed by telephone with Dr. Marvel Plan on 08/20/2014 at 11:22 .  He inquired also about a focal arterial dissection at the right carotid bifurcation (see sagittal image 50 of series 5011), but to me that more resembles another area of ulcerated soft plaque (in addition to that described in this report in the left carotid  and proximal left subclavian arteries).   Electronically Signed   By: Augusto Gamble M.D.   On: 08/20/2014 11:23   08/20/2014   CLINICAL DATA:  81 year old male with altered mental status after a fall several days ago. Small acute infarct right PCA territory detected on MRI. Initial encounter.  Current history of left neck carotid stenting in 2002.  EXAM: CT ANGIOGRAPHY HEAD AND NECK  TECHNIQUE: Multidetector CT imaging of the head and neck was performed using the standard protocol during bolus administration of intravenous contrast. Multiplanar CT image reconstructions and MIPs were obtained to evaluate the vascular anatomy. Carotid stenosis measurements (when applicable) are obtained utilizing NASCET criteria, using the distal internal carotid diameter as the denominator.  CONTRAST:  50mL OMNIPAQUE IOHEXOL 350 MG/ML SOLN  COMPARISON:  Brain MRI 08/17/2014. To noncontrast head and cervical spine CT 08/15/2014  FINDINGS: CT HEAD  Brain: Chronic confluent encephalomalacia in the left MCA territory. No midline shift, mass effect, or evidence of intracranial mass lesion. There is patchy abnormal hyperdensity in the posterior right hemisphere which is stable to slightly progressed since 08/15/2014, most apparent on series 21, image 16. This corresponds to the areas of diffusion abnormality (series 4, image 26 on 08/17/2014, and appears to be in the sulcus.  Stable gray-white matter differentiation. No evidence of acute or subacute cortically based infarct identified.  Calvarium and skull base: Stable.  Paranasal sinuses: Clear.  Orbits: Stable.  CTA NECK  Aortic arch: 3 vessel arch configuration with moderate arch atherosclerosis. No hemodynamically significant great vessel origin stenosis (up to 50% stenosis of the left CCA origin).  Right carotid system: Soft and calcified plaque at the right CCA origin without hemodynamically significant stenosis. Intermittent soft and calcified plaque to the right carotid bifurcation.  Moderate to severe plaque at the right carotid bifurcation resulting in high-grade stenosis approaching a radiographic string sign (series 509, image 134 and series 511, image 47). Despite this, the right ICA remains patent to the skullbase.  Left carotid system: Beyond its origin there is intermittent plaque in the left CCA, including ulcerated plaque at the level of the larynx (series 509, image 91). Vascular stent is  in place from the distal left CCA through the distal ball all of the left ICA. There is moderate to severe in stent stenosis with soft plaque demonstrated at the proximal third of the stent (about 12 mm into the stent). See series 509, image 120 in 07/2019, and sagittal image 109 of series 5011. Numerically stenosis is estimated at up to 80 % with respect to the distal vessel. Beyond the stented aspect of the vessel, the left ICA is negative except for mild calcified plaque to the skullbase.  Vertebral arteries:Calcified plaque in the proximal right subclavian artery without hemodynamically significant stenosis, however, there is soft and calcified plaque resulting in high-grade stenosis (string sign) at the right vertebral artery origin. See series 503, image 129). Dominant right vertebral artery is patent despite this finding, and otherwise negative to the skullbase.  Soft and calcified plaque in the proximal left subclavian artery. Ulcerated plaque (series 509, image 51), with up to 50 % stenosis with respect to the distal vessel proximal to the left vertebral artery origin. Soft plaque at the left vertebral artery origin with moderate to severe stenosis best seen on series 509 image 65. Tortuous proximal left vertebral artery. The left vertebral artery is non dominant and tortuous in the neck with occasional calcified plaque. It is patent to the skullbase without other hemodynamically significant stenosis identified.  Skeleton: Advanced degenerative changes in the cervical spine. T5 moderate to  severe compression fracture is new since 2013, probably new since chest radiographs in 2014, stable from 08/15/2014. Evidence of acute fracture planes on today's sagittal imaging (series 5011).  Other neck: Paraseptal emphysema and chronic lung scarring. Small layering right pleural effusion suspected and new. No superior mediastinal lymphadenopathy.  Thyroid, larynx, pharynx, parapharyngeal spaces, retropharyngeal space, sublingual space, submandibular glands, and parotid glands are within normal limits. No cervical lymphadenopathy.  CTA HEAD  Posterior circulation: Dominant distal right vertebral artery. Distal right vertebral calcified plaque but no hemodynamically significant stenosis of the distal vertebral arteries. Patent right PICA and bilateral AICA origins. Patent vertebrobasilar junction. No basilar artery stenosis. SCA and PCA origins are within normal limits. Posterior communicating arteries are diminutive or absent. Bilateral PCA branches are within normal limits.  Anterior circulation: Moderate ICA siphon calcified plaque. No petrous segment or cavernous segment stenosis. Mild bilateral supra clinoid ICA stenosis due to calcified plaque. Ophthalmic artery origins are within normal limits. Patent carotid termini, MCA and ACA origins.  Anterior communicating artery and bilateral ACA branches are within normal limits. Right MCA branches are within normal limits. Left MCA M1 segment and bifurcation are patent. Left MCA branches are smaller in caliber, but no major MCA branch occlusion is identified.  Venous sinuses: Within normal limits.  Anatomic variants: Dominant right vertebral artery.  Delayed phase: No abnormal enhancement identified.  IMPRESSION: 1. Small volume subarachnoid hemorrhage over the posterior right convexity, I believe it was these blood products resulting in the diffusion abnormality on the recent brain MRI, rather than small acute infarcts. This is likely this sequelae of recent fall,  and has not significantly changed since 08/15/2014. 2. Moderate to severe T5 compression fracture appears to be acute. If specific therapy such as vertebroplasty is desired, thoracic MRI or whole-body bone scan would best confirm acuity. 3. Left carotid in the neck was stented in 2002. There is high-grade in stent re- stenosis, numerically estimated at 80%. The left carotid system remains patent, with occasional left CCA ulcerated plaque. 4. High-grade right ICA stenosis related to soft and  calcified plaque, approaching a RADIOGRAPHIC STRING SIGN. 5. High-grade right vertebral artery origin stenosis (STRING SIGN). The right vertebral is dominant. Moderate to severe left vertebral artery origin stenosis. 6. Moderate intracranial supra clinoid ICA siphon stenosis due to calcified plaque. Otherwise negative intracranial CTA. 7. Chronic left MCA infarct.  Electronically Signed: By: Augusto Gamble M.D. On: 08/20/2014 11:08   Ct Cervical Spine Wo Contrast  08/15/2014   CLINICAL DATA:  Status post fall but did not lose consciousness after falling.  EXAM: CT HEAD WITHOUT CONTRAST  CT CERVICAL SPINE WITHOUT CONTRAST  TECHNIQUE: Multidetector CT imaging of the head and cervical spine was performed following the standard protocol without intravenous contrast. Multiplanar CT image reconstructions of the cervical spine were also generated.  COMPARISON:  None.  FINDINGS: CT HEAD FINDINGS  There is no evidence of mass effect, midline shift, or extra-axial fluid collections. There is no evidence of a space-occupying lesion or intracranial hemorrhage. There is no evidence of a cortical-based area of acute infarction. There is a large left MCA territory infarct with encephalomalacia. There is generalized cerebral atrophy. There is periventricular white matter low attenuation likely secondary to microangiopathy.  The ventricles and sulci are appropriate for the patient's age. The basal cisterns are patent.  Visualized portions of the  orbits are unremarkable. The visualized portions of the paranasal sinuses and mastoid air cells are unremarkable. Cerebrovascular atherosclerotic calcifications are noted.  The osseous structures are unremarkable.  CT CERVICAL SPINE FINDINGS  The alignment is anatomic. The vertebral body heights are maintained. There is no acute fracture. There is no static listhesis. The prevertebral soft tissues are normal. The intraspinal soft tissues are not fully imaged on this examination due to poor soft tissue contrast, but there is no gross soft tissue abnormality.  There is degenerative disc disease at C3-4, C5-6 and C6-7. There is osseous fusion of the right posterior elements at C3-4. There is severe right and moderate left facet arthropathy at C2-3 with bilateral foraminal narrowing. There is bilateral facet arthropathy, right worse than left, at C3-4 with bilateral uncovertebral degenerative changes resulting in severe bilateral foraminal encroachment. Bilateral facet arthropathy at C4-5. Bilateral facet arthropathy at C5-6 with a broad-based disc osteophyte complex and bilateral uncovertebral degenerative changes resulting in foraminal encroachment.  There is right carotid artery atherosclerosis. There is a left carotid artery stent. There is a 10 mm spiculated subpleural right upper lobe pulmonary nodule which may reflect an area of scarring. There are right apical blebs.  IMPRESSION: 1. No acute intracranial pathology. 2. No acute osseous injury of the cervical spine. 3. 10 mm spiculated subpleural right upper lobe pulmonary nodule which is more prominent compared with CT chest dated 09/09/2011. A dedicated non emergent CT of the chest is recommended for further evaluation. This may reflect scarring versus malignancy.   Electronically Signed   By: Elige Ko   On: 08/15/2014 20:15   Mr Brain Wo Contrast  08/17/2014   CLINICAL DATA:  Dementia up. Right-sided weakness. Possible stroke.  EXAM: MRI HEAD WITHOUT  CONTRAST  TECHNIQUE: Multiplanar, multiecho pulse sequences of the brain and surrounding structures were obtained without intravenous contrast.  COMPARISON:  Head CT 08/15/2014  FINDINGS: Diffusion imaging shows a small acute stroke in the right parieto-occipital region. No swelling, hemorrhage or mass effect. No other acute finding.  The brainstem is unremarkable. There is cerebellar atrophy without focal insult. The cerebral hemispheres elsewhere show old infarction in the left middle cerebral artery territory with atrophy, encephalomalacia and  gliosis. No obstructive hydrocephalus. No extra-axial collection. No sign of mass lesion. No pituitary mass. Sinuses are clear. Small amount of fluid in the mastoid air cells bilaterally.  IMPRESSION: Small acute infarction in the right parieto-occipital region without hemorrhage or mass effect.  Old left MCA territory infarction.   Electronically Signed   By: Paulina Fusi M.D.   On: 08/17/2014 21:02   Mr Abdomen Wo Contrast  08/20/2014   CLINICAL DATA:  81 year old male with elevated liver enzymes. Evaluate for biliary tract obstruction.  EXAM: MRI ABDOMEN WITHOUT CONTRAST  TECHNIQUE: Multiplanar multisequence MR imaging was performed without the administration of intravenous contrast.  COMPARISON:  CT of the abdomen and pelvis 08/19/2014.  FINDINGS: Comments: Per report from the technologist, the patient refused to continue with the examination after acquisition of only 2 pulse sequences. The patient was removed from the scanner per his request at that time.  Very limited imaging was performed prior to removal of the patient from the scanner. This consisted of only coronal and axial T2 weighted images through the abdomen. On these limited images the gallbladder was nearly completely decompressed. No definite gallstones. No definite signs of pericholecystic fluid or inflammation. No intrahepatic biliary ductal dilatation. Common bile duct was normal in caliber measuring  approximately 4 mm. Pancreatic duct also appears normal in caliber. No definite filling defects within the common bile duct to suggest choledocholithiasis at this time. No discrete hepatic lesions identified on T2 weighted sequences. However, both the spleen and liver appear diffusely low signal intensity on T2 weighted images, suggestive of iron deposition.  Multiple small T2 hyperintense lesions in the left kidney are incompletely characterize, but statistically likely tiny cysts. The appearance of the the right kidney and bilateral adrenal glands is unremarkable on today's examination.  IMPRESSION: 1. Exceedingly limited secondary to patient's request to terminate the examination prior to completion. Despite the limitations of this examination, there is no intra or extrahepatic biliary ductal dilatation to suggest biliary tract obstruction at this time. 2. T2 hypointensity throughout the liver and spleen may suggest iron deposition. 3. Additional findings, as above.   Electronically Signed   By: Trudie Reed M.D.   On: 08/20/2014 10:08   Ct Abdomen Pelvis W Contrast  08/19/2014   CLINICAL DATA:  Transaminitis.  EXAM: CT ABDOMEN AND PELVIS WITH CONTRAST  TECHNIQUE: Multidetector CT imaging of the abdomen and pelvis was performed using the standard protocol following bolus administration of intravenous contrast.  CONTRAST:  1 OMNIPAQUE IOHEXOL 300 MG/ML SOLN, 75mL OMNIPAQUE IOHEXOL 300 MG/ML SOLN  COMPARISON:  None.  FINDINGS: Small bilateral pleural effusions with basilar atelectasis. Peripheral fibrosis and honeycomb changes in the lungs suggesting usual interstitial pneumonitis. Emphysematous changes in the lung bases. Small pericardial effusion or thickening. Normal heart size. Coronary artery and aortic valve calcifications.  The liver, spleen, gallbladder, pancreas, adrenal glands, kidneys, inferior vena cava, and retroperitoneal lymph nodes are unremarkable. Diffuse calcification of the abdominal  aorta and branch vessels. Infrarenal abdominal aortic aneurysm measuring 2.7 cm diameter, compared with 1.8 cm diameter proximally. Calcification renal artery, celiac axis, and superior mesenteric artery origins. Calcification of the iliac arteries. Stomach and small bowel are decompressed. Diffusely stool-filled colon without abnormal distention. No free air or free fluid in the abdomen.  Pelvis: There is diffuse thickening of the bladder wall with asymmetric thickening in the anterior dome. Tenting of the bladder. 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 Small adjacent bladder diverticulum is demonstrated. Prostate gland  is not enlarged. No free or loculated pelvic fluid collections. Appendix is normal. Diverticulosis of the sigmoid colon. Mild wall thickening in the sigmoid colon may represent early diverticulitis. No abscess. Diffuse bone demineralization with compression fractures demonstrated at L5, L4, L3, L2, L1, and T11 levels. This is likely due to osteoporosis. Degenerative changes in the lumbar spine and hips.  IMPRESSION: 1. Small bilateral pleural effusions with basilar atelectasis 2. Emphysematous changes in the lungs with peripheral fibrosis and honeycomb changes. 3. Diffuse vascular calcifications. Small infrarenal abdominal aortic aneurysm measuring 2.7 cm diameter. 4. Asymmetric thickening of the bladder wall with tenting suggesting urachal remnant. 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 5. Diverticulosis of the sigmoid colon with mild wall thickening suggesting early diverticulitis. No abscess. 6. Diffuse bone demineralization and multiple vertebral compression fractures consistent with osteoporosis.   Electronically Signed   By: Burman Nieves M.D.   On: 08/19/2014 00:51   Dg Chest Port 1 View  08/16/2014   CLINICAL DATA:  Short of breath and cough for 2 days. Subsequent encounter.  EXAM: PORTABLE CHEST - 1 VIEW  COMPARISON:  08/15/2014.   Chest CT 09/09/2011.  FINDINGS: Chronic pulmonary fibrosis. Pulmonary parenchymal scarring. No pneumothorax. Cardiopericardial silhouette appears within normal limits. Mild tortuosity of the thoracic aorta with arch atherosclerosis. No displaced rib fractures are identified.  There is a mildly displaced fracture of the distal third of the RIGHT clavicle. No angulation is evident. LEFT-greater-than-RIGHT AC joint osteoarthritis. Visible proximal humerus appears intact.  Sclerosis in the proximal RIGHT humerus probably represents a bone infarct in this patient with interstitial lung disease. No pleural effusion.  IMPRESSION: 1. Mildly displaced distal RIGHT clavicle fracture. 2. No acute cardiopulmonary disease.  Chronic pulmonary fibrosis.   Electronically Signed   By: Andreas Newport M.D.   On: 08/16/2014 15:39    Micro Results    No results found for this or any previous visit (from the past 240 hour(s)).     Today   Subjective:   James Mcneil today has no headache,no chest abdominal pain,no new weakness tingling or numbness, feels much better wants to go home today.   Objective:   Blood pressure 124/53, pulse 81, temperature 97.7 F (36.5 C), temperature source Axillary, resp. rate 20, height 5\' 4"  (1.626 m), weight 55.384 kg (122 lb 1.6 oz), SpO2 96 %.   Intake/Output Summary (Last 24 hours) at 08/21/14 1250 Last data filed at 08/21/14 0247  Gross per 24 hour  Intake      0 ml  Output    200 ml  Net   -200 ml    Exam Awake Alert, Oriented x 3, No new F.N deficits, Normal affect [place].AT,PERRAL Supple Neck,No JVD, No cervical lymphadenopathy appriciated.  Symmetrical Chest wall movement, Good air movement bilaterally, CTAB RRR,No Gallops,Rubs or new Murmurs, No Parasternal Heave +ve B.Sounds, Abd Soft, Non tender, No organomegaly appriciated, No rebound -guarding or rigidity. No Cyanosis, Clubbing or edema, No new Rash or bruise  Data Review   CBC w Diff: Lab Results    Component Value Date   WBC 7.9 08/21/2014   HGB 10.7* 08/21/2014   HCT 32.6* 08/21/2014   PLT 328 08/21/2014   LYMPHOPCT 8* 08/19/2014   MONOPCT 7 08/19/2014   EOSPCT 0 08/19/2014   BASOPCT 0 08/19/2014    CMP: Lab Results  Component Value Date   NA 139 08/21/2014   K 4.4 08/21/2014   CL 103 08/21/2014   CO2 30 08/21/2014   BUN 25*  08/21/2014   CREATININE 0.80 08/21/2014   PROT 5.8* 08/21/2014   ALBUMIN 2.3* 08/21/2014   BILITOT 0.4 08/21/2014   ALKPHOS 236* 08/21/2014   AST 284* 08/21/2014   ALT 294* 08/21/2014  .   Total Time in preparing paper work, data evaluation and todays exam - 35 minutes  Alixandra Alfieri M.D on 08/21/2014 at 12:50 PM  [facility] Office  [phone]

## 2014-08-22 ENCOUNTER — Encounter (HOSPITAL_COMMUNITY): Payer: Self-pay | Admitting: Emergency Medicine

## 2014-08-22 ENCOUNTER — Inpatient Hospital Stay (HOSPITAL_COMMUNITY)
Admission: EM | Admit: 2014-08-22 | Discharge: 2014-09-18 | DRG: 208 | Disposition: E | Payer: Medicare Other | Attending: Emergency Medicine | Admitting: Emergency Medicine

## 2014-08-22 ENCOUNTER — Emergency Department (HOSPITAL_COMMUNITY): Payer: Medicare Other

## 2014-08-22 DIAGNOSIS — I469 Cardiac arrest, cause unspecified: Secondary | ICD-10-CM | POA: Diagnosis not present

## 2014-08-22 DIAGNOSIS — I4892 Unspecified atrial flutter: Secondary | ICD-10-CM | POA: Diagnosis present

## 2014-08-22 DIAGNOSIS — J449 Chronic obstructive pulmonary disease, unspecified: Secondary | ICD-10-CM | POA: Diagnosis present

## 2014-08-22 DIAGNOSIS — I350 Nonrheumatic aortic (valve) stenosis: Secondary | ICD-10-CM | POA: Diagnosis present

## 2014-08-22 DIAGNOSIS — R64 Cachexia: Secondary | ICD-10-CM | POA: Diagnosis present

## 2014-08-22 DIAGNOSIS — Z79899 Other long term (current) drug therapy: Secondary | ICD-10-CM

## 2014-08-22 DIAGNOSIS — R570 Cardiogenic shock: Secondary | ICD-10-CM | POA: Diagnosis present

## 2014-08-22 DIAGNOSIS — Z87891 Personal history of nicotine dependence: Secondary | ICD-10-CM

## 2014-08-22 DIAGNOSIS — R0602 Shortness of breath: Secondary | ICD-10-CM | POA: Insufficient documentation

## 2014-08-22 DIAGNOSIS — J9602 Acute respiratory failure with hypercapnia: Secondary | ICD-10-CM | POA: Diagnosis present

## 2014-08-22 DIAGNOSIS — F0390 Unspecified dementia without behavioral disturbance: Secondary | ICD-10-CM | POA: Diagnosis present

## 2014-08-22 DIAGNOSIS — Z515 Encounter for palliative care: Secondary | ICD-10-CM

## 2014-08-22 DIAGNOSIS — I69351 Hemiplegia and hemiparesis following cerebral infarction affecting right dominant side: Secondary | ICD-10-CM

## 2014-08-22 DIAGNOSIS — T17920A Food in respiratory tract, part unspecified causing asphyxiation, initial encounter: Secondary | ICD-10-CM | POA: Diagnosis not present

## 2014-08-22 DIAGNOSIS — J9601 Acute respiratory failure with hypoxia: Secondary | ICD-10-CM | POA: Insufficient documentation

## 2014-08-22 DIAGNOSIS — G931 Anoxic brain damage, not elsewhere classified: Secondary | ICD-10-CM | POA: Diagnosis present

## 2014-08-22 DIAGNOSIS — I10 Essential (primary) hypertension: Secondary | ICD-10-CM | POA: Diagnosis present

## 2014-08-22 DIAGNOSIS — I4891 Unspecified atrial fibrillation: Secondary | ICD-10-CM | POA: Diagnosis present

## 2014-08-22 DIAGNOSIS — E785 Hyperlipidemia, unspecified: Secondary | ICD-10-CM | POA: Diagnosis present

## 2014-08-22 DIAGNOSIS — Z66 Do not resuscitate: Secondary | ICD-10-CM | POA: Diagnosis present

## 2014-08-22 LAB — I-STAT ARTERIAL BLOOD GAS, ED
ACID-BASE DEFICIT: 6 mmol/L — AB (ref 0.0–2.0)
Bicarbonate: 23 mEq/L (ref 20.0–24.0)
O2 Saturation: 100 %
PO2 ART: 402 mmHg — AB (ref 80.0–100.0)
TCO2: 25 mmol/L (ref 0–100)
pCO2 arterial: 61.7 mmHg (ref 35.0–45.0)
pH, Arterial: 7.173 — CL (ref 7.350–7.450)

## 2014-08-22 LAB — COMPREHENSIVE METABOLIC PANEL
ALT: 615 U/L — ABNORMAL HIGH (ref 0–53)
AST: 676 U/L — ABNORMAL HIGH (ref 0–37)
Albumin: 2.3 g/dL — ABNORMAL LOW (ref 3.5–5.2)
Alkaline Phosphatase: 401 U/L — ABNORMAL HIGH (ref 39–117)
Anion gap: 17 — ABNORMAL HIGH (ref 5–15)
BUN: 34 mg/dL — ABNORMAL HIGH (ref 6–23)
CALCIUM: 8.5 mg/dL (ref 8.4–10.5)
CO2: 21 mmol/L (ref 19–32)
Chloride: 102 mmol/L (ref 96–112)
Creatinine, Ser: 1.23 mg/dL (ref 0.50–1.35)
GFR calc Af Amer: 58 mL/min — ABNORMAL LOW (ref >90)
GFR, EST NON AFRICAN AMERICAN: 50 mL/min — AB (ref >90)
Glucose, Bld: 261 mg/dL — ABNORMAL HIGH (ref 70–99)
Potassium: 4.2 mmol/L (ref 3.5–5.1)
Sodium: 140 mmol/L (ref 135–145)
Total Bilirubin: 0.6 mg/dL (ref 0.3–1.2)
Total Protein: 5.7 g/dL — ABNORMAL LOW (ref 6.0–8.3)

## 2014-08-22 LAB — I-STAT CHEM 8, ED
BUN: 40 mg/dL — ABNORMAL HIGH (ref 6–23)
Calcium, Ion: 1.11 mmol/L — ABNORMAL LOW (ref 1.13–1.30)
Chloride: 102 mmol/L (ref 96–112)
Creatinine, Ser: 1 mg/dL (ref 0.50–1.35)
GLUCOSE: 253 mg/dL — AB (ref 70–99)
HCT: 37 % — ABNORMAL LOW (ref 39.0–52.0)
HEMOGLOBIN: 12.6 g/dL — AB (ref 13.0–17.0)
Potassium: 4.2 mmol/L (ref 3.5–5.1)
SODIUM: 138 mmol/L (ref 135–145)
TCO2: 20 mmol/L (ref 0–100)

## 2014-08-22 LAB — BRAIN NATRIURETIC PEPTIDE: B Natriuretic Peptide: 201.1 pg/mL — ABNORMAL HIGH (ref 0.0–100.0)

## 2014-08-22 LAB — I-STAT TROPONIN, ED: Troponin i, poc: 0.1 ng/mL (ref 0.00–0.08)

## 2014-08-22 MED ORDER — SODIUM CHLORIDE 0.9 % IV BOLUS (SEPSIS)
1000.0000 mL | Freq: Once | INTRAVENOUS | Status: AC
  Administered 2014-08-22: 1000 mL via INTRAVENOUS

## 2014-08-22 NOTE — ED Notes (Addendum)
Joneen RoachPaul Hoffman NP ( critical care ) explained plan of care and discussed DNR status/comfort care to pt.'s daughter (POA).

## 2014-08-22 NOTE — H&P (Signed)
PULMONARY / CRITICAL CARE MEDICINE   Name: James Mcneil MRN: [id number] DOB: 06/03/1923    ADMISSION DATE:  08/30/2014 CONSULTATION DATE:  08/28/2014  REFERRING MD :  EDP  CHIEF COMPLAINT: witnessed arrest  INITIAL PRESENTATION: 81 year old male with PMH of SAH, CVA, COPD, HTN [facility] after a witnessed cardiac arrest. Downtime estimated less than 15 minutes. PCCM called for admission.   STUDIES:    SIGNIFICANT EVENTS: 2/3 cardiac arrest   HISTORY OF PRESENT ILLNESS:  81 year old male in with PMH as below, with PMH as below, which includes CVA, HTN, had recent admission for fall with small CVA and SAH. He was discharged to home 2/2 where he was eating his dinner on 2/3. Family witnessed him begin to choke and he turned blue. He then went down to floor. CPR was started. EMS estimates about 15 minutes of total downtime. There were some questions about appropriate code status for this patient and PCCM was called for admission.  PAST MEDICAL HISTORY :   has a past medical history of APHASIA, LE CEREBROVASCULAR DISEASE (2002); HYPERGLYCEMIA; HYPERLIPIDEMIA NEC/NOS; HYPERTENSION; Stroke (2002); and COPD.  has past surgical history that includes No past surgeries. Prior to Admission medications   Medication Sig Start Date End Date Taking? Authorizing Provider  acetaminophen (TYLENOL) 325 MG tablet Take 2 tablets (650 mg total) by mouth 2 (two) times daily at 10 AM and 5 PM. 09/02/11 11/13/13  Newt LukesValerie A Leschber, MD  amoxicillin-clavulanate (AUGMENTIN) 500-125 MG per tablet Take 1 tablet (500 mg total) by mouth 3 (three) times daily. 08/21/14   Simbiso Ranga, MD  aspirin 81 MG tablet Take 81 mg by mouth daily.      Historical Provider, MD  donepezil (ARICEPT) 10 MG tablet Take 1 tablet (10 mg total) by mouth at bedtime. 05/07/14   Newt LukesValerie A Leschber, MD  furosemide (LASIX) 20 MG tablet TAKE ONE TABLET BY MOUTH ONCE DAILY 04/13/14   Newt LukesValerie A Leschber, MD  metoprolol tartrate  (LOPRESSOR) 25 MG tablet Take 1 tablet (25 mg total) by mouth 2 (two) times daily. 05/07/14   Newt LukesValerie A Leschber, MD  mirtazapine (REMERON) 15 MG tablet Take 1 tablet (15 mg total) by mouth at bedtime. 05/07/14   Newt LukesValerie A Leschber, MD  Omega-3 Fatty Acids (OMEGA-3 FISH OIL) 1200 MG CAPS Take 1,200 mg by mouth daily.      Historical Provider, MD  pantoprazole (PROTONIX) 40 MG tablet TAKE ONE TABLET BY MOUTH EVERY DAY 07/25/13   Newt LukesValerie A Leschber, MD  tiZANidine (ZANAFLEX) 4 MG tablet Take 1 tablet (4 mg total) by mouth every 8 (eight) hours as needed (muscle spasm). 02/22/13   Newt LukesValerie A Leschber, MD  traMADol (ULTRAM) 50 MG tablet Take 1 tablet (50 mg total) by mouth every 8 (eight) hours as needed for pain. 02/22/13   Newt LukesValerie A Leschber, MD   No Known Allergies  FAMILY HISTORY:  indicated that his mother is deceased. He indicated that his father is deceased.  SOCIAL HISTORY:  reports that he has quit smoking. His smoking use included Cigarettes. He does not have any smokeless tobacco history on file. He reports that he does not drink alcohol or use illicit drugs.  REVIEW OF SYSTEMS:  unable  SUBJECTIVE:   VITAL SIGNS: Temp:  [96.5 F (35.8 C)] 96.5 F (35.8 C) (02/03 2329) Pulse Rate:  [101-110] 101 (02/03 2330) Resp:  [18-29] 29 (02/03 2330) BP: (104-118)/(45-68) 104/45 mmHg (02/03 2330) SpO2:  [100 %]  100 % (02/03 2330) FiO2 (%):  [100 %] 100 % (02/03 2330) HEMODYNAMICS:   VENTILATOR SETTINGS: Vent Mode:  [-] PRVC FiO2 (%):  [100 %] 100 % Set Rate:  [20 bmp] 20 bmp Vt Set:  [450 mL] 450 mL PEEP:  [5 cmH20] 5 cmH20 INTAKE / OUTPUT: No intake or output data in the 24 hours ending 08/26/2014 2358  PHYSICAL EXAMINATION: General:  Cachectic elderly male on vent in NAD Neuro:  Agitated on vent, moves all extremities. HEENT:  ETT in place, no JVD noted.  Cardiovascular:  Irreg irreg, no MRG noted Lungs:  Coarse bilateral breath sounds.  Abdomen:  Soft non-tender,  non-distended Musculoskeletal:  No acute deformity Skin:  Several areas of ecchymosis from prior falls  LABS:  CBC  Recent Labs Lab 08/20/14 0737 08/21/14 0428 09/15/2014 2300 09/12/2014 2316  WBC 9.5 7.9 22.8*  --   HGB 11.5* 10.7* 11.0* 12.6*  HCT 35.8* 32.6* 34.7* 37.0*  PLT 352 328 255  --    Coag's No results for input(s): APTT, INR in the last 168 hours. BMET  Recent Labs Lab 08/19/14 0330 08/20/14 0737 08/21/14 0428 09/02/2014 2316  NA 137 138 139 138  K 4.7 3.9 4.4 4.2  CL 102 102 103 102  CO2 --   BUN 25* 21 25* 40*  CREATININE 0.75 0.71 0.80 1.00  GLUCOSE 134* 97 104* 253*   Electrolytes  Recent Labs Lab 08/19/14 0330 08/20/14 0737 08/21/14 0428  CALCIUM 8.4 8.7 8.6   Sepsis Markers No results for input(s): LATICACIDVEN, PROCALCITON, O2SATVEN in the last 168 hours. ABG  Recent Labs Lab 09/03/2014 2339  PHART 7.173*  PCO2ART 61.7*  PO2ART 402.0*   Liver Enzymes  Recent Labs Lab 08/19/14 0330 08/20/14 0737 08/21/14 0428  AST 238* 263* 284*  ALT 172* 248* 294*  ALKPHOS 194* 231* 236*  BILITOT 1.1 0.3 0.4  ALBUMIN 2.1* 2.3* 2.3*   Cardiac Enzymes  Recent Labs Lab 08/16/14 0132 08/16/14 0925 08/16/14 1555  TROPONINI 0.04* 0.03 0.03   Glucose  Recent Labs Lab 08/16/14 0655 08/18/14 0639 08/20/14 0657 08/21/14 0627  GLUCAP 97 111* 89 96    Imaging No results found.   ASSESSMENT / PLAN:   Acute hypercarbic respiratory failure in setting of arrest COPD without exacerbation Cardiac arrest Hypotension Afib/flutter  - Extubate per family request - Full DNR - Full comfort care - Morphine gtt - Chaplain consult  Joneen Roach, AGACNP-BC [facility] Pulmonology/Critical Care Pager [phone] or [phone]

## 2014-08-22 NOTE — ED Provider Notes (Signed)
CSN: [id number]     Arrival date & time 08/27/2014  2300 History   First MD Initiated Contact with Patient 08/24/2014 2309     Chief Complaint  Patient presents with  . Cardiac Arrest     (Consider location/radiation/quality/duration/timing/severity/associated sxs/prior Treatment) HPI Patient with recent hospitalization after fall and traumatic subarachnoid hemorrhage discharged home yesterday. Per family the patient was sitting up eating today. He was witnessed choking and turning blue. He then was lowered to the floor and EMS was called. When EMS arrived patient was pulseless with food products in mouth.CPR began. Patient was suctioned and intubated. Given 3 rounds of epi with return of spontaneous circulation. Per EMS total downtime less than 15 minutes. Patient has been moving spontaneously since resuscitation requiring Versed en route. Past Medical History  Diagnosis Date  . APHASIA, LE CEREBROVASCULAR DISEASE 2002    expressive  . HYPERGLYCEMIA   . HYPERLIPIDEMIA NEC/NOS   . HYPERTENSION   . Stroke 2002    residual R>LUE and mild exp aphasia  . COPD    Past Surgical History  Procedure Laterality Date  . No past surgeries     No family history on file. History  Substance Use Topics  . Smoking status: Former Smoker    Types: Cigarettes  . Smokeless tobacco: Not on file  . Alcohol Use: No    Review of Systems  Unable to perform ROS     Allergies  Review of patient's allergies indicates no known allergies.  Home Medications   Prior to Admission medications   Medication Sig Start Date End Date Taking? Authorizing Provider  acetaminophen (TYLENOL) 325 MG tablet Take 2 tablets (650 mg total) by mouth 2 (two) times daily at 10 AM and 5 PM. 09/02/11 11/13/13  Newt Lukes, MD  amoxicillin-clavulanate (AUGMENTIN) 500-125 MG per tablet Take 1 tablet (500 mg total) by mouth 3 (three) times daily. 08/21/14   Simbiso Ranga, MD  aspirin 81 MG tablet Take 81 mg by mouth daily.       Historical Provider, MD  donepezil (ARICEPT) 10 MG tablet Take 1 tablet (10 mg total) by mouth at bedtime. 05/07/14   Newt Lukes, MD  furosemide (LASIX) 20 MG tablet TAKE ONE TABLET BY MOUTH ONCE DAILY 04/13/14   Newt Lukes, MD  metoprolol tartrate (LOPRESSOR) 25 MG tablet Take 1 tablet (25 mg total) by mouth 2 (two) times daily. 05/07/14   Newt Lukes, MD  mirtazapine (REMERON) 15 MG tablet Take 1 tablet (15 mg total) by mouth at bedtime. 05/07/14   Newt Lukes, MD  Omega-3 Fatty Acids (OMEGA-3 FISH OIL) 1200 MG CAPS Take 1,200 mg by mouth daily.      Historical Provider, MD  pantoprazole (PROTONIX) 40 MG tablet TAKE ONE TABLET BY MOUTH EVERY DAY 07/25/13   Newt Lukes, MD  tiZANidine (ZANAFLEX) 4 MG tablet Take 1 tablet (4 mg total) by mouth every 8 (eight) hours as needed (muscle spasm). 02/22/13   Newt Lukes, MD  traMADol (ULTRAM) 50 MG tablet Take 1 tablet (50 mg total) by mouth every 8 (eight) hours as needed for pain. 02/22/13   Newt Lukes, MD   BP 104/45 mmHg  Pulse 102  Temp(Src) 96.5 F (35.8 C) (Rectal)  Resp 18  SpO2 100% Physical Exam  Constitutional: He appears well-developed and well-nourished. No distress.  HENT:  Head: Normocephalic and atraumatic.  Mouth/Throat: Oropharynx is clear and moist.  ET tube in place  Eyes:  Pinpoint bilaterally. Minimally reactive  Neck: No tracheal deviation present.  Cardiovascular: Regular rhythm.   Tachycardia  Pulmonary/Chest: Effort normal. No respiratory distress. He has wheezes (faint expiratory wheezing throughout). He has no rales.  Abdominal: Soft. Bowel sounds are normal. He exhibits distension. He exhibits no mass. There is no tenderness. There is no rebound and no guarding.  Musculoskeletal: Normal range of motion. He exhibits no edema or tenderness.  Scattered bruises. No calf swelling or tenderness.  Neurological:  Intermittent breathing over that. There is no spontaneous  movement.  Skin: Skin is warm and dry. No rash noted. No erythema.  Nursing note and vitals reviewed.   ED Course  Procedures (including critical care time) Labs Review Labs Reviewed  I-STAT CHEM 8, ED - Abnormal; Notable for the following:    BUN 40 (*)    Glucose, Bld 253 (*)    Calcium, Ion 1.11 (*)    Hemoglobin 12.6 (*)    HCT 37.0 (*)    All other components within normal limits  I-STAT TROPOININ, ED - Abnormal; Notable for the following:    Troponin i, poc 0.10 (*)    All other components within normal limits  CBC WITH DIFFERENTIAL/PLATELET  COMPREHENSIVE METABOLIC PANEL  BRAIN NATRIURETIC PEPTIDE  BLOOD GAS, ARTERIAL    Imaging Review No results found.   EKG Interpretation   Date/Time:  Wednesday August 22 2014 23:02:38 EST Ventricular Rate:  118 PR Interval:  225 QRS Duration: 118 QT Interval:  347 QTC Calculation: 486 R Axis:   73 Text Interpretation:  Sinus tachycardia Prolonged PR interval Incomplete  right bundle branch block Low voltage, precordial leads Confirmed by OTTER   MD, OLGA ([id number]) on 09/11/2014 11:48:40 PM     CRITICAL CARE Performed by: Ranae PalmsYELVERTON, Yaqub Arney Total critical care time: 30 min Critical care time was exclusive of separately billable procedures and treating other patients. Critical care was necessary to treat or prevent imminent or life-threatening deterioration. Critical care was time spent personally by me on the following activities: development of treatment plan with patient and/or surrogate as well as nursing, discussions with consultants, evaluation of patient's response to treatment, examination of patient, obtaining history from patient or surrogate, ordering and performing treatments and interventions, ordering and review of laboratory studies, ordering and review of radiographic studies, pulse oximetry and re-evaluation of patient's condition.  MDM   Final diagnoses:  SOB (shortness of breath)   Discussed with family.  States they wish to have patient's DO NOT RESUSCITATE upheld.  Critical care will see in the emergency department. Cooling procedures have been initiated.  Critical care bedside. We will extubate patient and admit.   Loren Raceravid Ron Beske, MD 04-27-2015 Lyda [id number]

## 2014-08-22 NOTE — ED Notes (Signed)
Pt. arrived with EMS from home , witnessed cardiac arrest at home this evening , initial rhythm PEA received 3 mg Epinephrine and Versed 2.5 mg , intubated at scene ET no. 7.0 at 25 cm . Cold NS IV initiated , tachycardic at arrival with I/O at left lower extremity .

## 2014-08-23 ENCOUNTER — Encounter (HOSPITAL_COMMUNITY): Payer: Self-pay | Admitting: *Deleted

## 2014-08-23 DIAGNOSIS — Z66 Do not resuscitate: Secondary | ICD-10-CM | POA: Diagnosis present

## 2014-08-23 DIAGNOSIS — I69351 Hemiplegia and hemiparesis following cerebral infarction affecting right dominant side: Secondary | ICD-10-CM | POA: Diagnosis not present

## 2014-08-23 DIAGNOSIS — I350 Nonrheumatic aortic (valve) stenosis: Secondary | ICD-10-CM | POA: Diagnosis present

## 2014-08-23 DIAGNOSIS — J9601 Acute respiratory failure with hypoxia: Secondary | ICD-10-CM | POA: Insufficient documentation

## 2014-08-23 DIAGNOSIS — I4891 Unspecified atrial fibrillation: Secondary | ICD-10-CM | POA: Diagnosis present

## 2014-08-23 DIAGNOSIS — W44F3XA Food entering into or through a natural orifice, initial encounter: Secondary | ICD-10-CM | POA: Insufficient documentation

## 2014-08-23 DIAGNOSIS — I469 Cardiac arrest, cause unspecified: Secondary | ICD-10-CM | POA: Diagnosis present

## 2014-08-23 DIAGNOSIS — G931 Anoxic brain damage, not elsewhere classified: Secondary | ICD-10-CM | POA: Diagnosis present

## 2014-08-23 DIAGNOSIS — T17920A Food in respiratory tract, part unspecified causing asphyxiation, initial encounter: Secondary | ICD-10-CM | POA: Insufficient documentation

## 2014-08-23 DIAGNOSIS — Z87891 Personal history of nicotine dependence: Secondary | ICD-10-CM | POA: Diagnosis not present

## 2014-08-23 DIAGNOSIS — J9602 Acute respiratory failure with hypercapnia: Secondary | ICD-10-CM | POA: Diagnosis present

## 2014-08-23 DIAGNOSIS — R64 Cachexia: Secondary | ICD-10-CM | POA: Diagnosis present

## 2014-08-23 DIAGNOSIS — Z79899 Other long term (current) drug therapy: Secondary | ICD-10-CM | POA: Diagnosis not present

## 2014-08-23 DIAGNOSIS — F0390 Unspecified dementia without behavioral disturbance: Secondary | ICD-10-CM | POA: Diagnosis present

## 2014-08-23 DIAGNOSIS — I4892 Unspecified atrial flutter: Secondary | ICD-10-CM | POA: Diagnosis present

## 2014-08-23 DIAGNOSIS — R0602 Shortness of breath: Secondary | ICD-10-CM | POA: Insufficient documentation

## 2014-08-23 DIAGNOSIS — J449 Chronic obstructive pulmonary disease, unspecified: Secondary | ICD-10-CM | POA: Diagnosis present

## 2014-08-23 DIAGNOSIS — E785 Hyperlipidemia, unspecified: Secondary | ICD-10-CM | POA: Diagnosis present

## 2014-08-23 DIAGNOSIS — Z515 Encounter for palliative care: Secondary | ICD-10-CM | POA: Diagnosis not present

## 2014-08-23 DIAGNOSIS — R570 Cardiogenic shock: Secondary | ICD-10-CM | POA: Diagnosis present

## 2014-08-23 DIAGNOSIS — I10 Essential (primary) hypertension: Secondary | ICD-10-CM | POA: Diagnosis present

## 2014-08-23 LAB — CBC WITH DIFFERENTIAL/PLATELET
BASOS ABS: 0 10*3/uL (ref 0.0–0.1)
Basophils Relative: 0 % (ref 0–1)
EOS ABS: 0.2 10*3/uL (ref 0.0–0.7)
EOS PCT: 1 % (ref 0–5)
HEMATOCRIT: 34.7 % — AB (ref 39.0–52.0)
Hemoglobin: 11 g/dL — ABNORMAL LOW (ref 13.0–17.0)
LYMPHS ABS: 5.7 10*3/uL — AB (ref 0.7–4.0)
Lymphocytes Relative: 25 % (ref 12–46)
MCH: 31.3 pg (ref 26.0–34.0)
MCHC: 31.7 g/dL (ref 30.0–36.0)
MCV: 98.6 fL (ref 78.0–100.0)
MONO ABS: 0.9 10*3/uL (ref 0.1–1.0)
Monocytes Relative: 4 % (ref 3–12)
NEUTROS ABS: 16 10*3/uL — AB (ref 1.7–7.7)
NEUTROS PCT: 70 % (ref 43–77)
PLATELETS: 255 10*3/uL (ref 150–400)
RBC: 3.52 MIL/uL — AB (ref 4.22–5.81)
RDW: 16.3 % — AB (ref 11.5–15.5)
WBC: 22.8 10*3/uL — AB (ref 4.0–10.5)

## 2014-08-23 MED ORDER — MORPHINE SULFATE 25 MG/ML IV SOLN
10.0000 mg/h | INTRAVENOUS | Status: DC
  Administered 2014-08-23: 10 mg/h via INTRAVENOUS
  Filled 2014-08-23: qty 10

## 2014-08-23 MED ORDER — MORPHINE BOLUS VIA INFUSION
5.0000 mg | INTRAVENOUS | Status: DC | PRN
  Administered 2014-08-23: 5 mg via INTRAVENOUS
  Filled 2014-08-23: qty 20

## 2014-08-24 ENCOUNTER — Telehealth: Payer: Self-pay | Admitting: Pulmonary Disease

## 2014-08-24 ENCOUNTER — Ambulatory Visit: Payer: Medicare Other | Admitting: Internal Medicine

## 2014-08-24 NOTE — Telephone Encounter (Signed)
Spoke with Erin at the funeral home, wants to know if VS would be willing to sign this death certificate.  They will bring it to whatever campus in [place] to have VS sign this if he is willing.  I advised Denny Peonrin that VS has been working nights all week but we would let her know as soon as we know from him, which may not be until Monday.  Dr. Craige CottaSood are you ok with signing this death certificate?  If so, do you want this dropped off at the hospital or [facility]?  Thanks!

## 2014-08-25 NOTE — Telephone Encounter (Signed)
I can sign death certificate.  [facility].  Please have courier page when they come to hospital, and I will sign death certificate.

## 2014-08-27 NOTE — Telephone Encounter (Signed)
LM for Erin to return call

## 2014-08-28 NOTE — Telephone Encounter (Signed)
[place] and [place] to [name]. The death certificate has already been signed by VS. Nothing further needed.

## 2014-08-31 ENCOUNTER — Ambulatory Visit: Payer: Medicare Other | Admitting: Internal Medicine

## 2014-09-18 NOTE — Discharge Summary (Signed)
James Mcneil was a 81 y.o. male admitted on 09/16/2014 with choking episode while eating.  He developed respiratory leading to cardiac arrest.  He was started on CPR and intubated by EMS.  He was d/c from hospital one day prior after being treated for CVA and SAH.  After further discussion with pt's family it was determined he would not want to be on ventilator under any circumstances.  He was made DNR status, and subsequently extubated.  He subsequently expired on 06-24-2015 at 2:35 AM.  Final diagnoses: Asphyxiation of food Acute respiratory failure Cardiac arrest Anoxic encephalopathy Atrial fibrillation Hx of moderate aortic stenosis Cardiogenic shock Hx of COPD Hx of CVA with Rt hemiplegia Hx of Subarachnoid hemorrage Hx of dementia  James HellingVineet Colt Martelle, MD [facility] 09/03/2014, 3:06 AM

## 2014-09-18 NOTE — ED Notes (Signed)
NP advised nurse to hold Morphine drip at this time until further notice.

## 2014-09-18 NOTE — Progress Notes (Signed)
Witnessed patient expired at [id number] with family members.  No respiration or pulse appreciated. Another RN to verify  death of patient. Will update MD.

## 2014-09-18 NOTE — Progress Notes (Signed)
Spoke with James Mcneil ME regarding patient. "It is okay to release body to the funeral home." Will update bed placement.

## 2014-09-18 NOTE — Progress Notes (Signed)
Family at beside. Awaiting for some more family to arrived.

## 2014-09-18 NOTE — Plan of Care (Signed)
Problem: Discharge Progression Outcomes Goal: Other Discharge Outcomes/Goals Outcome: Completed/Met Date Met:  08/31/2014 Body taken to morgue.  [facility] of choice- [facility].     

## 2014-09-18 NOTE — ED Notes (Signed)
Attempted to call report

## 2014-09-18 DEATH — deceased

## 2014-11-06 ENCOUNTER — Ambulatory Visit: Payer: Medicare Other | Admitting: Internal Medicine

## 2015-12-28 IMAGING — CT CT ANGIO NECK
1 of 13 series · 2 of 33 positions shown · IV contrast (Iohexol (Omnipaque 350))
Comparison: Brain MRI 08/17/2014. To noncontrast head and cervical
spine CT 08/15/2014

ADDENDUM:
Study discussed by telephone with Dr. BENGT-OLOV ANDRESEN on 08/20/2014 at
[DATE] .

He inquired also about a focal arterial dissection at the right
carotid bifurcation (see sagittal image 50 of series 7111), but to
me that more resembles another area of ulcerated soft plaque (in
addition to that described in this report in the left carotid and
proximal left subclavian arteries).
CLINICAL DATA: 89-year-old male with altered mental status after a
fall several days ago. Small acute infarct right PCA territory
detected on MRI. Initial encounter.
Current history of left neck carotid stenting in 6996.
EXAM:
CT ANGIOGRAPHY HEAD AND NECK
TECHNIQUE: Multidetector CT imaging of the head and neck was performed using
the standard protocol during bolus administration of intravenous
contrast. Multiplanar CT image reconstructions and MIPs were
obtained to evaluate the vascular anatomy. Carotid stenosis
measurements (when applicable) are obtained utilizing NASCET
criteria, using the distal internal carotid diameter as the
denominator.
CONTRAST:  50mL OMNIPAQUE IOHEXOL 350 MG/ML SOLN

[Series 509: axial (id) · axial · 0.51mm/px · z∈[+145,+257]mm · 2 of 334 slices shown]
[im 112/334  soft-tissue]
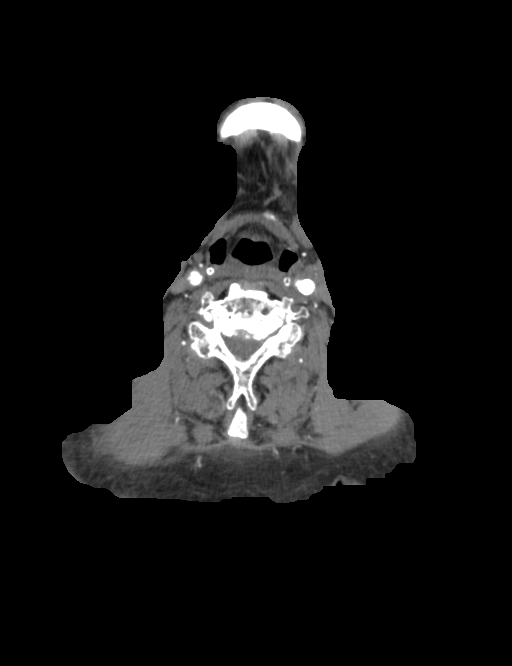
[im 223/334  bone]
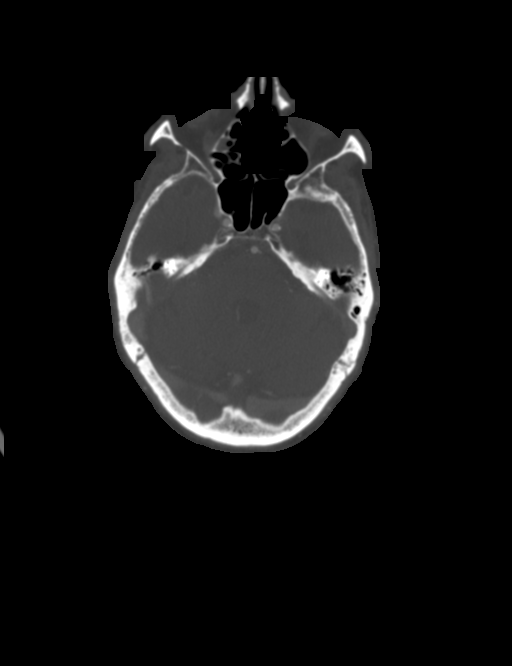

[2 of 33 positions shown; findings below may reference images not displayed]

FINDINGS: CT HEAD

Brain: Chronic confluent encephalomalacia in the left MCA territory.
No midline shift, mass effect, or evidence of intracranial mass
lesion. There is patchy abnormal hyperdensity in the posterior right
hemisphere which is stable to slightly progressed since 08/15/2014,
most apparent on series 21, image 16. This corresponds to the areas
of diffusion abnormality (series 4, image 26 on 08/17/2014, and
appears to be in the sulcus.

Stable gray-white matter differentiation. No evidence of acute or
subacute cortically based infarct identified.

Calvarium and skull base: Stable.

Paranasal sinuses: Clear.

Orbits: Stable.

CTA NECK

Aortic arch: 3 vessel arch configuration with moderate arch
atherosclerosis. No hemodynamically significant great vessel origin
stenosis (up to 50% stenosis of the left CCA origin).

Right carotid system: Soft and calcified plaque at the right CCA
origin without hemodynamically significant stenosis. Intermittent
soft and calcified plaque to the right carotid bifurcation. Moderate
to severe plaque at the right carotid bifurcation resulting in
high-grade stenosis approaching a radiographic string sign (series
509, image 134 and series 511, image 47). Despite this, the right
ICA remains patent to the skullbase.

Left carotid system: Beyond its origin there is intermittent plaque
in the left CCA, including ulcerated plaque at the level of the
larynx (series 509, image 91). Vascular stent is in place from the
distal left CCA through the distal ball all of the left ICA. There
is moderate to severe in stent stenosis with soft plaque
demonstrated at the proximal third of the stent (about 12 mm into
the stent). See series 509, image 1[REDACTED], and sagittal image
109 of series 7111. Numerically stenosis is estimated at up to 80 %
with respect to the distal vessel. Beyond the stented aspect of the
vessel, the left ICA is negative except for mild calcified plaque to
the skullbase.

Vertebral arteries:Calcified plaque in the proximal right subclavian
artery without hemodynamically significant stenosis, however, there
is soft and calcified plaque resulting in high-grade stenosis
(string sign) at the right vertebral artery origin. See series 503,
image 129). Dominant right vertebral artery is patent despite this
finding, and otherwise negative to the skullbase.

Soft and calcified plaque in the proximal left subclavian artery.
Ulcerated plaque (series 509, image 51), with up to 50 % stenosis
with respect to the distal vessel proximal to the left vertebral
artery origin. Soft plaque at the left vertebral artery origin with
moderate to severe stenosis best seen on series 509 image 65.
Tortuous proximal left vertebral artery. The left vertebral artery
is non dominant and tortuous in the neck with occasional calcified
plaque. It is patent to the skullbase without other hemodynamically
significant stenosis identified.

Skeleton: Advanced degenerative changes in the cervical spine. T5
moderate to severe compression fracture is new since 9875, probably
new since chest radiographs in 0308, stable from [DATE].
Evidence of acute fracture planes on today's sagittal imaging
(series 7111).

Other neck: Paraseptal emphysema and chronic lung scarring. Small
layering right pleural effusion suspected and new. No superior
mediastinal lymphadenopathy.

Thyroid, larynx, pharynx, parapharyngeal spaces, retropharyngeal
space, sublingual space, submandibular glands, and parotid glands
are within normal limits. No cervical lymphadenopathy.

CTA HEAD

Posterior circulation: Dominant distal right vertebral artery.
Distal right vertebral calcified plaque but no hemodynamically
significant stenosis of the distal vertebral arteries. Patent right
PICA and bilateral AICA origins. Patent vertebrobasilar junction. No
basilar artery stenosis. SCA and PCA origins are within normal
limits. Posterior communicating arteries are diminutive or absent.
Bilateral PCA branches are within normal limits.

Anterior circulation: Moderate ICA siphon calcified plaque. No
petrous segment or cavernous segment stenosis. Mild bilateral supra
clinoid ICA stenosis due to calcified plaque. Ophthalmic artery
origins are within normal limits. Patent carotid termini, MCA and
ACA origins.

Anterior communicating artery and bilateral ACA branches are within
normal limits. Right MCA branches are within normal limits. Left MCA
M1 segment and bifurcation are patent. Left MCA branches are smaller
in caliber, but no major MCA branch occlusion is identified.

Venous sinuses: Within normal limits.

Anatomic variants: Dominant right vertebral artery.

Delayed phase: No abnormal enhancement identified.
IMPRESSION: 1. Small volume subarachnoid hemorrhage over the posterior right
convexity, I believe it was these blood products resulting in the
diffusion abnormality on the recent brain MRI, rather than small
acute infarcts. This is likely this sequelae of recent fall, and has
not significantly changed since 08/15/2014.
08/15/2014. Moderate to severe T5 compression fracture appears to be acute.
If specific therapy such as vertebroplasty is desired, thoracic MRI
or whole-body bone scan would best confirm acuity.
3. Left carotid in the neck was stented in 6996. There is high-grade
in stent re- stenosis, numerically estimated at 80%. The left
carotid system remains patent, with occasional left CCA ulcerated
plaque.
4. High-grade right ICA stenosis related to soft and calcified
plaque, approaching a RADIOGRAPHIC STRING SIGN.
5. High-grade right vertebral artery origin stenosis (STRING SIGN).
The right vertebral is dominant. Moderate to severe left vertebral
artery origin stenosis.
6. Moderate intracranial supra clinoid ICA siphon stenosis due to
calcified plaque. Otherwise negative intracranial CTA.
7. Chronic left MCA infarct.

## 2015-12-30 IMAGING — CR DG CHEST 1V PORT
1 series · 1 of 1 positions shown · non-contrast
Comparison: Chest radiograph performed 08/16/2014

CLINICAL DATA: Endotracheal tube placement. Shortness of breath.
Initial encounter.

EXAM:
PORTABLE CHEST - 1 VIEW

[AP]
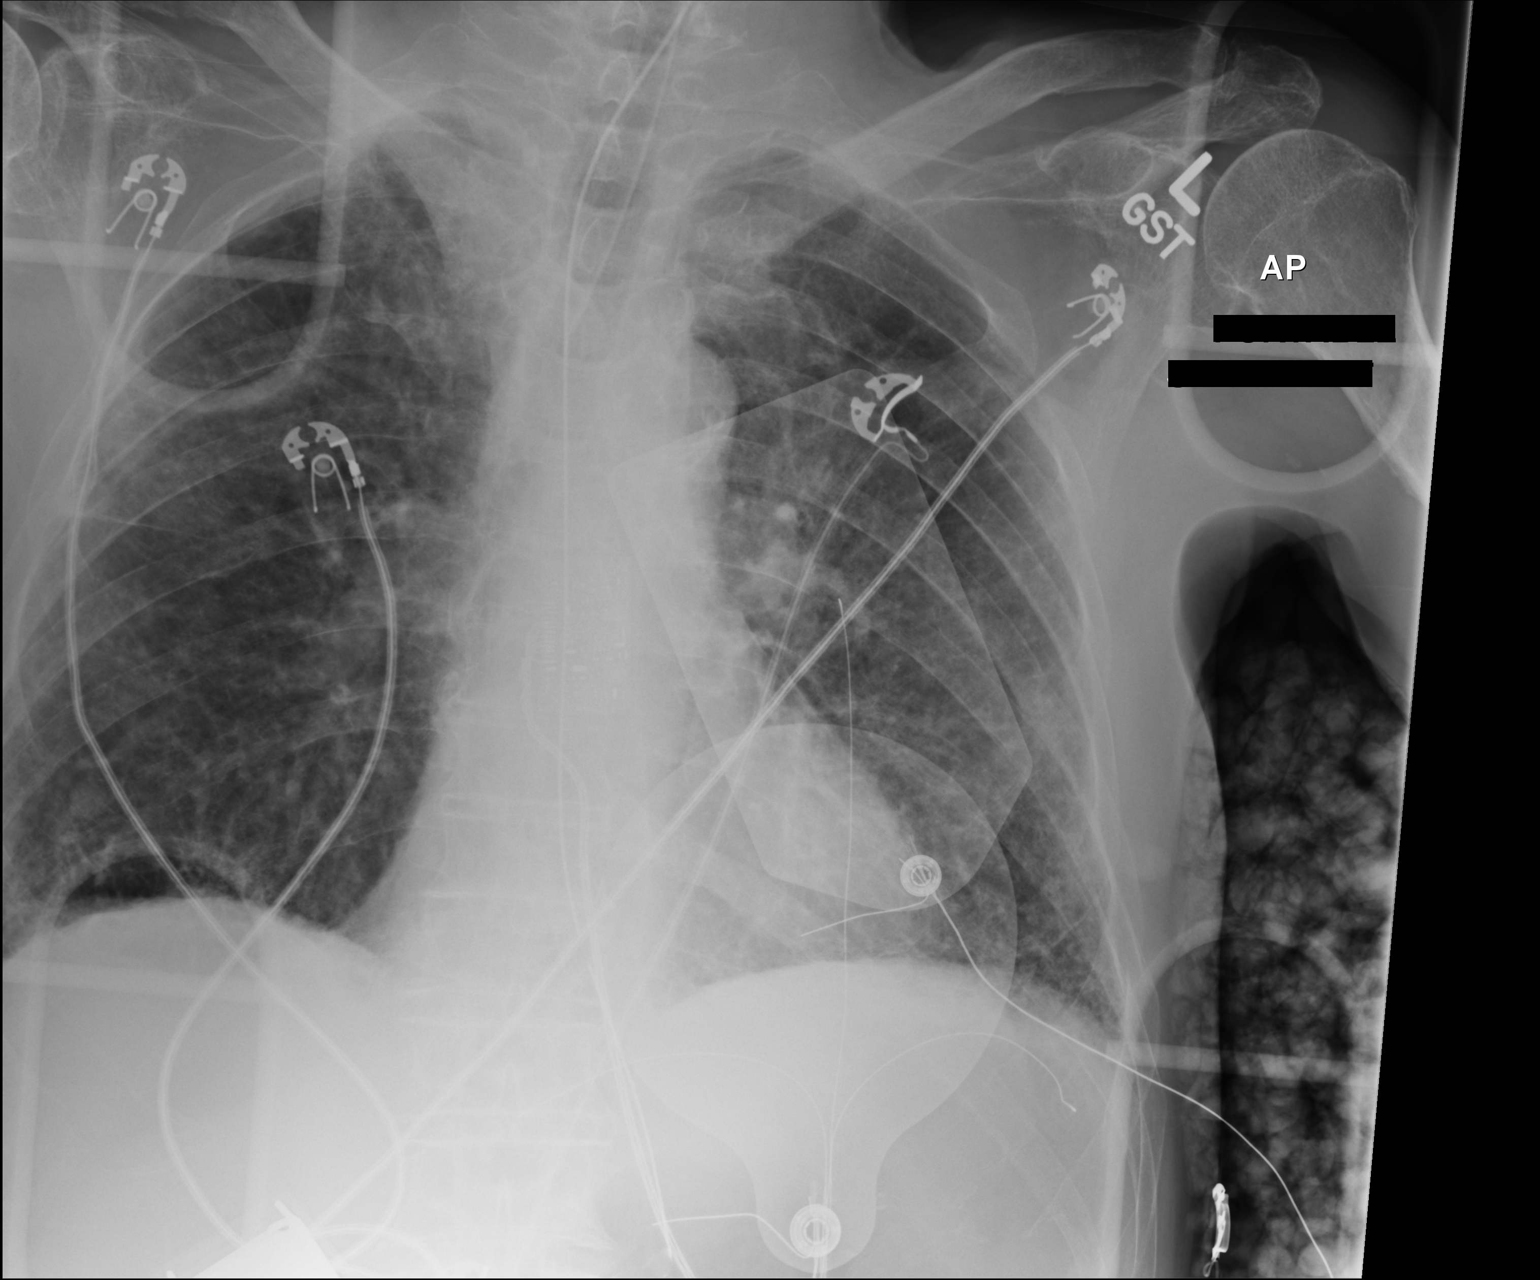

[1 of 1 positions shown; findings below may reference images not displayed]

FINDINGS: The patient's endotracheal tube is seen ending 2-3 cm above the
carina. An enteric tube is noted extending below the diaphragm.
External pacing pads are seen.

Mild vascular congestion is noted. Mildly increased interstitial
markings are seen, raising question of mild interstitial edema. No
definite pleural effusion or pneumothorax identified, though the
right lung base is incompletely characterized on this study.

The cardiomediastinal silhouette remains normal in size. A minimally
displaced fracture of the right lateral clavicle is again noted.
IMPRESSION: 1. Endotracheal tube seen ending 2-3 cm above the carina.
2. Mild vascular congestion. Mildly increased interstitial markings
may reflect mild interstitial edema.
3. Minimally displaced fracture of the right lateral clavicle again
noted.
